# Patient Record
Sex: Male | Born: 1995 | Race: White | Hispanic: No | Marital: Single | Smoking: Never smoker
Health system: Southern US, Community
[De-identification: ages and names within clinical notes are randomized; demographics above are authoritative.]

## PROBLEM LIST (undated history)

## (undated) HISTORY — PX: TONSILLECTOMY AND ADENOIDECTOMY: SHX28

## (undated) HISTORY — PX: TYMPANOSTOMY TUBE PLACEMENT: SHX32

---

## 2019-01-19 ENCOUNTER — Other Ambulatory Visit: Payer: Self-pay

## 2019-01-19 ENCOUNTER — Emergency Department: Payer: PRIVATE HEALTH INSURANCE

## 2019-01-19 ENCOUNTER — Inpatient Hospital Stay
Admission: EM | Admit: 2019-01-19 | Discharge: 2019-01-21 | DRG: 872 | Disposition: A | Discharge status: Home / Self Care | Payer: PRIVATE HEALTH INSURANCE | Attending: Internal Medicine | Admitting: Internal Medicine

## 2019-01-19 DIAGNOSIS — A419 Sepsis, unspecified organism: Secondary | ICD-10-CM

## 2019-01-19 DIAGNOSIS — J020 Streptococcal pharyngitis: Secondary | ICD-10-CM | POA: Diagnosis present

## 2019-01-19 DIAGNOSIS — K529 Noninfective gastroenteritis and colitis, unspecified: Secondary | ICD-10-CM | POA: Diagnosis present

## 2019-01-19 DIAGNOSIS — R9431 Abnormal electrocardiogram [ECG] [EKG]: Secondary | ICD-10-CM

## 2019-01-19 DIAGNOSIS — R079 Chest pain, unspecified: Secondary | ICD-10-CM

## 2019-01-19 DIAGNOSIS — Z79899 Other long term (current) drug therapy: Secondary | ICD-10-CM

## 2019-01-19 DIAGNOSIS — D72829 Elevated white blood cell count, unspecified: Secondary | ICD-10-CM

## 2019-01-19 DIAGNOSIS — R0602 Shortness of breath: Secondary | ICD-10-CM | POA: Diagnosis not present

## 2019-01-19 DIAGNOSIS — B950 Streptococcus, group A, as the cause of diseases classified elsewhere: Secondary | ICD-10-CM | POA: Diagnosis present

## 2019-01-19 DIAGNOSIS — M4184 Other forms of scoliosis, thoracic region: Secondary | ICD-10-CM | POA: Diagnosis present

## 2019-01-19 DIAGNOSIS — R109 Unspecified abdominal pain: Secondary | ICD-10-CM | POA: Diagnosis present

## 2019-01-19 DIAGNOSIS — I880 Nonspecific mesenteric lymphadenitis: Secondary | ICD-10-CM | POA: Diagnosis present

## 2019-01-19 DIAGNOSIS — F1290 Cannabis use, unspecified, uncomplicated: Secondary | ICD-10-CM | POA: Diagnosis present

## 2019-01-19 LAB — CBC WITH DIFFERENTIAL/PLATELET
Abs Immature Granulocytes: 0.25 10*3/uL — ABNORMAL HIGH (ref 0.00–0.07)
Basophils Absolute: 0.1 10*3/uL (ref 0.0–0.1)
Basophils Relative: 0 %
Eosinophils Absolute: 0 10*3/uL (ref 0.0–0.5)
Eosinophils Relative: 0 %
HCT: 54.1 % — ABNORMAL HIGH (ref 39.0–52.0)
Hemoglobin: 19 g/dL — ABNORMAL HIGH (ref 13.0–17.0)
Immature Granulocytes: 1 %
Lymphocytes Relative: 2 %
Lymphs Abs: 0.5 10*3/uL — ABNORMAL LOW (ref 0.7–4.0)
MCH: 31.1 pg (ref 26.0–34.0)
MCHC: 35.1 g/dL (ref 30.0–36.0)
MCV: 88.5 fL (ref 80.0–100.0)
MONO ABS: 2 10*3/uL — AB (ref 0.1–1.0)
MONOS PCT: 7 %
Neutro Abs: 25.6 10*3/uL — ABNORMAL HIGH (ref 1.7–7.7)
Neutrophils Relative %: 90 %
Platelets: 282 10*3/uL (ref 150–400)
RBC: 6.11 MIL/uL — ABNORMAL HIGH (ref 4.22–5.81)
RDW: 11.9 % (ref 11.5–15.5)
WBC: 28.3 10*3/uL — ABNORMAL HIGH (ref 4.0–10.5)
nRBC: 0 % (ref 0.0–0.2)

## 2019-01-19 LAB — BASIC METABOLIC PANEL
ANION GAP: 15 (ref 5–15)
BUN: 15 mg/dL (ref 6–20)
CO2: 20 mmol/L — ABNORMAL LOW (ref 22–32)
Calcium: 9.8 mg/dL (ref 8.9–10.3)
Chloride: 101 mmol/L (ref 98–111)
Creatinine, Ser: 1.08 mg/dL (ref 0.61–1.24)
GFR calc Af Amer: >60 mL/min (ref >60)
GFR calc non Af Amer: >60 mL/min (ref >60)
Glucose, Bld: 165 mg/dL — ABNORMAL HIGH (ref 70–99)
Potassium: 3.8 mmol/L (ref 3.5–5.1)
Sodium: 136 mmol/L (ref 135–145)

## 2019-01-19 LAB — CBC
HCT: 54.2 % — ABNORMAL HIGH (ref 39.0–52.0)
Hemoglobin: 19.1 g/dL — ABNORMAL HIGH (ref 13.0–17.0)
MCH: 31 pg (ref 26.0–34.0)
MCHC: 35.2 g/dL (ref 30.0–36.0)
MCV: 87.8 fL (ref 80.0–100.0)
Platelets: 288 10*3/uL (ref 150–400)
RBC: 6.17 MIL/uL — ABNORMAL HIGH (ref 4.22–5.81)
RDW: 12 % (ref 11.5–15.5)
WBC: 28.7 10*3/uL — AB (ref 4.0–10.5)
nRBC: 0 % (ref 0.0–0.2)

## 2019-01-19 LAB — MONONUCLEOSIS SCREEN: MONO SCREEN: NEGATIVE

## 2019-01-19 LAB — INFLUENZA PANEL BY PCR (TYPE A & B)
Influenza A By PCR: NEGATIVE
Influenza B By PCR: NEGATIVE

## 2019-01-19 LAB — TROPONIN I

## 2019-01-19 LAB — GROUP A STREP BY PCR: Group A Strep by PCR: DETECTED — AB

## 2019-01-19 MED ORDER — SODIUM CHLORIDE 0.9 % IV BOLUS: 1000.0000 mL | Freq: Once | INTRAVENOUS | Status: DC

## 2019-01-19 MED ORDER — IOHEXOL 300 MG/ML  SOLN
100.0000 mL | Freq: Once | INTRAMUSCULAR | Status: AC | PRN
  Administered 2019-01-19: 100 mL via INTRAVENOUS

## 2019-01-19 MED ORDER — SODIUM CHLORIDE 0.9 % IV SOLN
2.0000 g | Freq: Once | INTRAVENOUS | Status: AC
  Administered 2019-01-19: 2 g via INTRAVENOUS
  Filled 2019-01-19: qty 20

## 2019-01-19 MED ORDER — METRONIDAZOLE IN NACL 5-0.79 MG/ML-% IV SOLN
500.0000 mg | Freq: Once | INTRAVENOUS | Status: AC
  Administered 2019-01-19: 500 mg via INTRAVENOUS
  Filled 2019-01-19: qty 100

## 2019-01-19 MED ORDER — MORPHINE SULFATE (PF) 4 MG/ML IV SOLN
4.0000 mg | Freq: Once | INTRAVENOUS | Status: AC
  Administered 2019-01-19: 4 mg via INTRAVENOUS
  Filled 2019-01-19: qty 1

## 2019-01-19 MED ORDER — ONDANSETRON HCL 4 MG/2ML IJ SOLN
4.0000 mg | Freq: Once | INTRAMUSCULAR | Status: AC
  Administered 2019-01-19: 4 mg via INTRAVENOUS
  Filled 2019-01-19: qty 2

## 2019-01-19 MED ORDER — KETOROLAC TROMETHAMINE 30 MG/ML IJ SOLN
30.0000 mg | Freq: Once | INTRAMUSCULAR | Status: AC
  Administered 2019-01-19: 30 mg via INTRAVENOUS
  Filled 2019-01-19: qty 1

## 2019-01-19 NOTE — ED Triage Notes (Signed)
Pt arrives with complaints of shortness of breath that started 2 weeks prior. Pt states he was seen when the shortness of breath/cough started and was prescribed antibiotics for a "bacterial infection." Pt states he finished that prescribed antibiotic but still was not feeling better and went back to the doctor yesterday. Pt states he had new symptoms yesterday of sore throat and swollen lymph nodes. Pt states he started second antibiotic today and became sick on his stomach.

## 2019-01-19 NOTE — ED Notes (Signed)
Patient transported to CT 

## 2019-01-19 NOTE — ED Provider Notes (Signed)
[facility] Emergency Department Provider Note ____________________________________________   First MD Initiated Contact with Patient 01/19/19 1837     (approximate)  I have reviewed the triage vital signs and the nursing notes.  HISTORY  Chief Complaint Shortness of Breath  HPI Emily Filbertlex Tine is a 23 y.o. male here for evaluation for  shortness of breath cough and recent infection that is not getting better  Patient reports about 6 to 7 days ago he started to have a sore throat swollen lymph nodes and a cough.  [facility] and he was given azithromycin when she took a Z-Pak.  Then about 2 days ago he was re-seen as he was not getting better and he was started on cefdinir which she has been taking for 2 days but not improving  He continues report that he is having cough, shortness of breath, pain along the left lower ribs and left upper abdomen.  Started having vomiting today reports he is vomited several times not black or bloody.  He is also continued to have a sore throat, muscle aches.  Reports that they checked [facility], but the have not been able to get his result yet.  Denies any travel history.  He has a friend who is been with the traveled to [place] but that was 2 months ago.  He has not been checked for the flu yet.  No lightheadedness.  Does not feel too short of breath right now, but mostly reports pain in the left upper abdominal region that radiates slightly the left lower back  He has not been on any steroid medication  History reviewed. No pertinent past medical history.  There are no active problems to display for this patient.   History reviewed. No pertinent surgical history.  Prior to Admission medications   Medication Sig Start Date End Date Taking? Authorizing Provider  albuterol (PROVENTIL HFA;VENTOLIN HFA) 108 (90 Base) MCG/ACT inhaler Inhale 2 puffs into the lungs every 4 (four) hours as needed.  01/09/19  Yes [provider]  cefdinir (OMNICEF) 300 MG capsule Take 1 capsule by mouth 2 (two) times daily. 01/19/19  Yes [provider]  azithromycin (ZITHROMAX) 250 MG tablet Take 1 tablet by mouth as directed. 01/09/19   [provider]    Allergies Patient has no known allergies.  No family history on file.  Social History Social History   Tobacco Use  . Smoking status: Not on file  Substance Use Topics  . Alcohol use: Not on file  . Drug use: Not on file  No vaping Denies alcohol and illicit drug use for occasional smoking of marijuana  Review of Systems Constitutional: Had chills and muscle aches when this started about a week ago. Eyes: No visual changes. ENT: Sore throat.  Denies neck pain.  Reports swollen lymph nodes however. Cardiovascular: Denies chest pain. Respiratory: Denies shortness of breath at rest, but does get winded easily when walking. Gastrointestinal: Some abdominal pain located primarily in the left upper abdomen radiates slightly to the left back. Genitourinary: Negative for dysuria. Musculoskeletal: Negative for back pain except for some discomfort in the left back pointing towards his lower thoracic upper lumbar region. Skin: Negative for rash. Neurological: Negative for headaches, areas of focal weakness or numbness.    ____________________________________________   PHYSICAL EXAM:  VITAL SIGNS: ED Triage Vitals  Enc Vitals Group     BP 01/19/19 1756 128/87  Pulse Rate 01/19/19 1756 100     Resp 01/19/19 1756 16     Temp 01/19/19 1756 98.9 F (37.2 C)     Temp Source 01/19/19 1756 Oral     SpO2 01/19/19 1756 100 %     Weight 01/19/19 1757 190 lb (86.2 kg)     Height 01/19/19 1757  (1.778 m)     Head Circumference --      Peak Flow --      Pain Score 01/19/19 1757 6     Pain Loc --      Pain Edu? --      Excl. in GC? --     Constitutional: Alert and oriented.  Appears in some discomfort, pacing  back and forth in the room reporting he is having a lot of pain in the left upper abdominal region Eyes: Conjunctivae are normal. Head: Atraumatic. Nose: No congestion/rhinnorhea. Mouth/Throat: Mucous membranes are lightly dry, previous tonsillectomy, his posterior oropharynx does appear notably injected but without any shift or evidence of mass or edema. Neck: No stridor.  No meningismus.  He has shotty tender anterior and posterior cervical adenopathy bilateral Cardiovascular: Minimally tachycardic rate, regular rhythm. Grossly normal heart sounds.  Good peripheral circulation. Respiratory: Normal respiratory effort.  No retractions. Lungs CTAB.  Speaks in full and clear sentences with normal oxygen saturation. Gastrointestinal: Soft and ports tenderness primarily in the left upper quadrant, no rebound or guarding, does report focal discomfort in the left upper quadrant.  No pain to McBurney's point.  Negative Murphy.  No peritonitis.. No distention. Musculoskeletal: No lower extremity tenderness nor edema. Neurologic:  Normal speech and language. No gross focal neurologic deficits are appreciated.  Skin:  Skin is warm, dry and intact. No rash noted. Psychiatric: Mood and affect are normal. Speech and behavior are normal.  ____________________________________________   LABS (all labs ordered are listed, but only abnormal results are displayed)  Labs Reviewed  GROUP A STREP BY PCR - Abnormal; Notable for the following components:      Result Value   Group A Strep by PCR DETECTED (*)    All other components within normal limits  BASIC METABOLIC PANEL - Abnormal; Notable for the following components:   CO2 20 (*)    Glucose, Bld 165 (*)    All other components within normal limits  CBC - Abnormal; Notable for the following components:   WBC 28.7 (*)    RBC 6.17 (*)    Hemoglobin 19.1 (*)    HCT 54.2 (*)    All other components within normal limits  CBC WITH DIFFERENTIAL/PLATELET -  Abnormal; Notable for the following components:   WBC 28.3 (*)    RBC 6.11 (*)    Hemoglobin 19.0 (*)    HCT 54.1 (*)    Neutro Abs 25.6 (*)    Lymphs Abs 0.5 (*)    Monocytes Absolute 2.0 (*)    Abs Immature Granulocytes 0.25 (*)    All other components within normal limits  CULTURE, BLOOD (ROUTINE X 2)  CULTURE, BLOOD (ROUTINE X 2)  TROPONIN I  MONONUCLEOSIS SCREEN  INFLUENZA PANEL BY PCR (TYPE A & B)  URINALYSIS, COMPLETE (UACMP) WITH MICROSCOPIC   ____________________________________________  EKG  Reviewed enterotomy at 1755 Heart rate 102 QRS 80 QTc 400 Sinus tachycardia, deep T wave inversions in inferolateral leads.  No ST elevation. ____________________________________________  RADIOLOGY  Dg Chest 2 View  Result Date: 01/19/2019 CLINICAL DATA:  Cough and shortness of breath  EXAM: CHEST - 2 VIEW COMPARISON:  None. FINDINGS: Lungs are clear. Heart size and pulmonary vascularity are normal. No adenopathy. There is mild upper thoracic dextroscoliosis. IMPRESSION: No edema or consolidation. Electronically Signed   By: Bretta Bang III M.D.   On: 01/19/2019 18:44   Ct Abdomen Pelvis W Contrast  Result Date: 01/19/2019 CLINICAL DATA:  Abdominal pain and vomiting EXAM: CT ABDOMEN AND PELVIS WITH CONTRAST TECHNIQUE: Multidetector CT imaging of the abdomen and pelvis was performed using the standard protocol following bolus administration of intravenous contrast. CONTRAST:  OMNIPAQUE IOHEXOL 300 MG/ML  SOLN COMPARISON:  None. FINDINGS: Lower chest: Lung bases are clear. Hepatobiliary: No focal liver lesions are apparent. The gallbladder wall is not appreciably thickened. There is no biliary duct dilatation. Pancreas: There is no pancreatic mass or inflammatory focus. Spleen: No splenic lesions are appreciable. There is a small accessory spleen medially. Adrenals/Urinary Tract: Adrenals bilaterally appear normal. Kidneys bilaterally show no evident mass or hydronephrosis.  There is no renal or ureteral calculus on either side. Urinary bladder is midline with wall thickness within normal limits. Stomach/Bowel: There is no appreciable bowel wall or mesenteric thickening. There is no appreciable bowel obstruction. There is no free air or portal venous air. Vascular/Lymphatic: There is no abdominal aortic aneurysm. No vascular lesions are evident. There is no adenopathy in the abdomen or pelvis by size criteria. There are several subcentimeter lymph nodes in the right abdomen. Reproductive: Prostate and seminal vesicles are normal in size and contour. There is no evident pelvic mass. Other: There is no appendiceal region inflammation. There is no abscess or ascites in the abdomen or pelvis. Musculoskeletal: There are no blastic or lytic bone lesions. There is no intramuscular or abdominal wall lesion. IMPRESSION: 1. There are several right abdominal subcentimeter mesenteric lymph nodes. In the appropriate clinical setting, this finding may indicate a degree of mesenteric adenitis. By size criteria, there is no frank adenopathy in the abdomen or pelvis. 2. No evident bowel obstruction. No abscess in the abdomen pelvis. No appendiceal region inflammatory change. 3.  No renal or ureteral calculus.  No hydronephrosis. Electronically Signed   By: Bretta Bang III M.D.   On: 01/19/2019 19:35    Above imaging studies both discussed with infectious disease physician. ____________________________________________   PROCEDURES  Procedure(s) performed: None  Procedures  Critical Care performed: No  ____________________________________________   INITIAL IMPRESSION / ASSESSMENT AND PLAN / ED COURSE  Pertinent labs & imaging results that were available during my care of the patient were reviewed by me and considered in my medical decision making (see chart for details).   Here for evaluation with sore throat, cough, worsening symptoms despite treatment with 2 antibiotics.   Some slight shortness of breath and now developing vomiting and left upper abdominal pain.  Overall the patient does appear mildly ill, he is in no acute distress but did have tachycardia on presentation and his leukocytosis is quite notable with significant left shift.  Also of note, his EKG is abnormal, certainly not what we would expect for a young healthy male, with notable T wave inversions in inferolateral distribution.  He does however deny any chest pain.    I discussed the patient's case with infectious disease Dr. Joylene Draft.  She recommends that given the patient's presentation, she would consider also the possibility of fusiform bacteria or anaerobics given his notably elevated white count after treatment with 2 antibiotics.  And recommends initiation of Flagyl in addition to ceftriaxone.  Blood cultures have been ordered, additional antibiotic Flagyl.  His EKG changes are unusual as well, I discussed his case and care with Dr. Sheryle Hail of the hospitalist service.  Will admit the patient for further work-up and management, I suspect an unusual or atypical infectious etiology and infectious disease recommends blood cultures which have been ordered as well as consideration for other etiologies such as septic emboli etc.  Discussed with the patient, will admit for further care and management, anticipate infectious disease consultation tomorrow which I have spoken to ID about.  Hospitalist Dr. Sheryle Hail admitting.  ____________________________________________   FINAL CLINICAL IMPRESSION(S) / ED DIAGNOSES  Final diagnoses:  Group A streptococcal infection  Leukocytosis, unspecified type  EKG abnormalities  Sepsis, due to unspecified organism, unspecified whether acute organ dysfunction present [facility])  Meet sepsis criteria, elevated heart rate on EKG, severe leukocytosis and high suspicion for infection including confirmed group A strep also exclude other etiologies or concomitant infection       Note:  This document was prepared using Dragon voice recognition software and may include unintentional dictation errors       Sharyn Creamer, MD 01/20/19 0015

## 2019-01-20 DIAGNOSIS — D72829 Elevated white blood cell count, unspecified: Secondary | ICD-10-CM

## 2019-01-20 DIAGNOSIS — F1290 Cannabis use, unspecified, uncomplicated: Secondary | ICD-10-CM | POA: Diagnosis present

## 2019-01-20 DIAGNOSIS — R0602 Shortness of breath: Secondary | ICD-10-CM | POA: Diagnosis present

## 2019-01-20 DIAGNOSIS — I880 Nonspecific mesenteric lymphadenitis: Secondary | ICD-10-CM | POA: Diagnosis present

## 2019-01-20 DIAGNOSIS — K529 Noninfective gastroenteritis and colitis, unspecified: Secondary | ICD-10-CM | POA: Diagnosis present

## 2019-01-20 DIAGNOSIS — J020 Streptococcal pharyngitis: Secondary | ICD-10-CM

## 2019-01-20 DIAGNOSIS — R9341 Abnormal radiologic findings on diagnostic imaging of renal pelvis, ureter, or bladder: Secondary | ICD-10-CM

## 2019-01-20 DIAGNOSIS — B950 Streptococcus, group A, as the cause of diseases classified elsewhere: Secondary | ICD-10-CM | POA: Diagnosis present

## 2019-01-20 DIAGNOSIS — R9431 Abnormal electrocardiogram [ECG] [EKG]: Secondary | ICD-10-CM | POA: Diagnosis not present

## 2019-01-20 DIAGNOSIS — A419 Sepsis, unspecified organism: Secondary | ICD-10-CM | POA: Diagnosis present

## 2019-01-20 DIAGNOSIS — Z79899 Other long term (current) drug therapy: Secondary | ICD-10-CM | POA: Diagnosis not present

## 2019-01-20 DIAGNOSIS — R109 Unspecified abdominal pain: Secondary | ICD-10-CM | POA: Diagnosis present

## 2019-01-20 DIAGNOSIS — M4184 Other forms of scoliosis, thoracic region: Secondary | ICD-10-CM | POA: Diagnosis present

## 2019-01-20 LAB — COMPREHENSIVE METABOLIC PANEL
ALT: 15 U/L (ref 0–44)
ANION GAP: 10 (ref 5–15)
AST: 17 U/L (ref 15–41)
Albumin: 4.1 g/dL (ref 3.5–5.0)
Alkaline Phosphatase: 53 U/L (ref 38–126)
BUN: 15 mg/dL (ref 6–20)
CO2: 25 mmol/L (ref 22–32)
Calcium: 8.9 mg/dL (ref 8.9–10.3)
Chloride: 103 mmol/L (ref 98–111)
Creatinine, Ser: 0.95 mg/dL (ref 0.61–1.24)
GFR calc Af Amer: >60 mL/min (ref >60)
GFR calc non Af Amer: >60 mL/min (ref >60)
GLUCOSE: 115 mg/dL — AB (ref 70–99)
Potassium: 4.5 mmol/L (ref 3.5–5.1)
Sodium: 138 mmol/L (ref 135–145)
Total Bilirubin: 0.6 mg/dL (ref 0.3–1.2)
Total Protein: 7.5 g/dL (ref 6.5–8.1)

## 2019-01-20 LAB — GASTROINTESTINAL PANEL BY PCR, STOOL (REPLACES STOOL CULTURE)
ASTROVIRUS: NOT DETECTED
Adenovirus F40/41: NOT DETECTED
Campylobacter species: NOT DETECTED
Cryptosporidium: NOT DETECTED
Cyclospora cayetanensis: NOT DETECTED
Entamoeba histolytica: NOT DETECTED
Enteroaggregative E coli (EAEC): NOT DETECTED
Enteropathogenic E coli (EPEC): NOT DETECTED
Enterotoxigenic E coli (ETEC): NOT DETECTED
Giardia lamblia: NOT DETECTED
NOROVIRUS GI/GII: NOT DETECTED
Plesimonas shigelloides: NOT DETECTED
Rotavirus A: NOT DETECTED
SAPOVIRUS (I, II, IV, AND V): NOT DETECTED
SHIGA LIKE TOXIN PRODUCING E COLI (STEC): NOT DETECTED
Salmonella species: NOT DETECTED
Shigella/Enteroinvasive E coli (EIEC): NOT DETECTED
Vibrio cholerae: NOT DETECTED
Vibrio species: NOT DETECTED
Yersinia enterocolitica: NOT DETECTED

## 2019-01-20 LAB — CKMB (ARMC ONLY)
CK, MB: 1.8 ng/mL (ref 0.5–5.0)
CK, MB: 1.2 ng/mL (ref 0.5–5.0)

## 2019-01-20 LAB — URINALYSIS, COMPLETE (UACMP) WITH MICROSCOPIC
BILIRUBIN URINE: NEGATIVE
Glucose, UA: NEGATIVE mg/dL
Hgb urine dipstick: NEGATIVE
Ketones, ur: 20 mg/dL — AB
Leukocytes,Ua: NEGATIVE
Nitrite: NEGATIVE
PROTEIN: NEGATIVE mg/dL
Specific Gravity, Urine: >1.046 — ABNORMAL HIGH (ref 1.005–1.030)
Squamous Epithelial / HPF: NONE SEEN (ref 0–5)
pH: 5 (ref 5.0–8.0)

## 2019-01-20 LAB — CBC
HCT: 44.3 % (ref 39.0–52.0)
Hemoglobin: 15.4 g/dL (ref 13.0–17.0)
MCH: 30.8 pg (ref 26.0–34.0)
MCHC: 34.8 g/dL (ref 30.0–36.0)
MCV: 88.6 fL (ref 80.0–100.0)
PLATELETS: 252 10*3/uL (ref 150–400)
RBC: 5 MIL/uL (ref 4.22–5.81)
RDW: 12 % (ref 11.5–15.5)
WBC: 14.6 10*3/uL — ABNORMAL HIGH (ref 4.0–10.5)
nRBC: 0 % (ref 0.0–0.2)

## 2019-01-20 LAB — URINE DRUG SCREEN, QUALITATIVE (ARMC ONLY)
Amphetamines, Ur Screen: NOT DETECTED
BENZODIAZEPINE, UR SCRN: NOT DETECTED
Barbiturates, Ur Screen: NOT DETECTED
Cannabinoid 50 Ng, Ur ~~LOC~~: POSITIVE — AB
Cocaine Metabolite,Ur ~~LOC~~: NOT DETECTED
MDMA (Ecstasy)Ur Screen: NOT DETECTED
Methadone Scn, Ur: NOT DETECTED
Opiate, Ur Screen: NOT DETECTED
Phencyclidine (PCP) Ur S: NOT DETECTED
Tricyclic, Ur Screen: NOT DETECTED

## 2019-01-20 LAB — LACTIC ACID, PLASMA
Lactic Acid, Venous: 0.9 mmol/L (ref 0.5–1.9)
Lactic Acid, Venous: 0.7 mmol/L (ref 0.5–1.9)

## 2019-01-20 LAB — TROPONIN I: Troponin I: <0.03 ng/mL (ref <0.03)

## 2019-01-20 LAB — C DIFFICILE QUICK SCREEN W PCR REFLEX
C DIFFICLE (CDIFF) ANTIGEN: NEGATIVE
C Diff interpretation: NOT DETECTED
C Diff toxin: NEGATIVE

## 2019-01-20 LAB — LACTOFERRIN, FECAL, QUALITATIVE: Lactoferrin, Fecal, Qual: POSITIVE — AB

## 2019-01-20 MED ORDER — MORPHINE SULFATE (PF) 2 MG/ML IV SOLN: 2.0000 mg | Freq: Three times a day (TID) | INTRAVENOUS | Status: DC | PRN

## 2019-01-20 MED ORDER — KETOROLAC TROMETHAMINE 30 MG/ML IJ SOLN
30.0000 mg | Freq: Four times a day (QID) | INTRAMUSCULAR | Status: DC | PRN
  Administered 2019-01-20: 30 mg via INTRAVENOUS
  Filled 2019-01-20: qty 1

## 2019-01-20 MED ORDER — ACETAMINOPHEN 650 MG RE SUPP: 650.0000 mg | Freq: Four times a day (QID) | RECTAL | Status: DC | PRN

## 2019-01-20 MED ORDER — METRONIDAZOLE IN NACL 5-0.79 MG/ML-% IV SOLN
500.0000 mg | Freq: Three times a day (TID) | INTRAVENOUS | Status: DC
  Administered 2019-01-20 – 2019-01-21 (×4): 500 mg via INTRAVENOUS
  Filled 2019-01-20 (×6): qty 100

## 2019-01-20 MED ORDER — BISACODYL 5 MG PO TBEC: 5.0000 mg | DELAYED_RELEASE_TABLET | Freq: Every day | ORAL | Status: DC | PRN

## 2019-01-20 MED ORDER — ONDANSETRON HCL 4 MG/2ML IJ SOLN: 4.0000 mg | Freq: Four times a day (QID) | INTRAMUSCULAR | Status: DC | PRN

## 2019-01-20 MED ORDER — ALBUTEROL SULFATE (2.5 MG/3ML) 0.083% IN NEBU: 2.5000 mg | INHALATION_SOLUTION | RESPIRATORY_TRACT | Status: DC | PRN

## 2019-01-20 MED ORDER — OXYCODONE HCL 5 MG PO TABS
5.0000 mg | ORAL_TABLET | ORAL | Status: DC | PRN
  Administered 2019-01-20 (×3): 5 mg via ORAL
  Filled 2019-01-20 (×3): qty 1

## 2019-01-20 MED ORDER — SODIUM CHLORIDE 0.9 % IV SOLN
INTRAVENOUS | Status: DC
  Administered 2019-01-20: 02:00:00 via INTRAVENOUS

## 2019-01-20 MED ORDER — TRAZODONE HCL 50 MG PO TABS
25.0000 mg | ORAL_TABLET | Freq: Every evening | ORAL | Status: DC | PRN
  Administered 2019-01-20: 25 mg via ORAL
  Filled 2019-01-20: qty 1

## 2019-01-20 MED ORDER — SODIUM CHLORIDE 0.9 % IV SOLN
2.0000 g | INTRAVENOUS | Status: DC
  Administered 2019-01-20: 2 g via INTRAVENOUS
  Filled 2019-01-20: qty 20
  Filled 2019-01-20: qty 2

## 2019-01-20 MED ORDER — ONDANSETRON HCL 4 MG PO TABS: 4.0000 mg | ORAL_TABLET | Freq: Four times a day (QID) | ORAL | Status: DC | PRN

## 2019-01-20 MED ORDER — ACETAMINOPHEN 325 MG PO TABS: 650.0000 mg | ORAL_TABLET | Freq: Four times a day (QID) | ORAL | Status: DC | PRN

## 2019-01-20 MED ORDER — MORPHINE SULFATE (PF) 2 MG/ML IV SOLN: 2.0000 mg | INTRAVENOUS | Status: DC | PRN

## 2019-01-20 MED ORDER — ENOXAPARIN SODIUM 40 MG/0.4ML ~~LOC~~ SOLN
40.0000 mg | SUBCUTANEOUS | Status: DC
  Filled 2019-01-20: qty 0.4

## 2019-01-20 NOTE — H&P (Signed)
[facility] -  at [facility]   PATIENT NAME: Jesse Roy    MR#:  [id number]  DATE OF BIRTH:  May 20, 1996  DATE OF ADMISSION:  01/19/2019  PRIMARY CARE PHYSICIAN: Patient, No Pcp Per   REQUESTING/REFERRING PHYSICIAN: Sharyn Creamer, MD  CHIEF COMPLAINT:   Chief Complaint  Patient presents with  . Shortness of Breath  Sore throat, feeling sick, left upper quadrant abdominal pain  HISTORY OF PRESENT ILLNESS:  Jesse Roy  is a 23 y.o. male with no chronic medical problems who presented to the emergency room with acute onset of sore throat as well as left upper quadrant abdominal pain and recurrent nausea and vomiting with associated left upper back pain leading from his left upper abdominal quadrant.  He stated that he has been feeling sick over the last couple of weeks.  He goes to [facility] and has been having rhinorrhea and nasal congestion as well as cough productive of clear sputum about 11 days ago for which she was seen there and was given Z-Pak.  He stated that he felt better by Friday that is 3 days later.  On Monday he felt sick again and developed sore throat with cervical tender lymphadenopathy.  He denied any fever or chills then.  He stated that he was tested for mono spot test at that time when he was seen back but does not know the results.  He was given a prescription for clindamycin of which he took 2 doses and then started to have vomiting all day and could not keep any fluids down.  He denied any bilious vomitus or hematemesis.  He admitted to watery diarrhea without melena or bright red blood.  He said that the left upper back pain was excruciating.  He denied any dysuria oliguria or hematuria or flank pain.  No cough or wheezing or hemoptysis.  He denies any chest pain.  Upon presentation to the emergency room, his vital signs were within normal.  His CBC was remarkable for leukocytosis of 14.6 with neutrophils of 90% and absolute neutrophils of  25.6.  His influenza AB antigen came back negative as well as his Monospot test.  His group A strep by PCR came back positive.  He had 2 blood cultures drawn.  He had abdominal and pelvic CT scan which revealed possible mesenteric adenitis with no other acute abnormalities.  Two-view portable chest showed mild thoracic dextro scoliosis.  EKG showed sinus tachycardia with a rate of 102 with T wave inversion laterally and biatrial enlargement..  The patient was given IV Rocephin and Flagyl as well as IV Toradol and morphine sulfate as well as Zofran and 2 L bolus of IV normal saline emergency room.  He was initially placed on droplet precautions and that was later discontinued.  He is admitted to a medical monitored bed for further evaluation and management PAST MEDICAL HISTORY:  History reviewed. No pertinent past medical history.  PAST SURGICAL HISTORY:  1. Tonsillectomy 2.  4 myringotomy tubes  SOCIAL HISTORY:   Social History   Tobacco Use  . Smoking status: Never Smoker  . Smokeless tobacco: Never Used  Substance Use Topics  . Alcohol use: Not on file    FAMILY HISTORY:  No family history on file.  The patient denies any family history medical problems  DRUG ALLERGIES:  No Known Allergies  REVIEW OF SYSTEMS:   ROS As per history of present illness. All pertinent systems were reviewed above. Constitutional,  HEENT,  cardiovascular, respiratory, GI, GU, musculoskeletal, neuro, psychiatric, endocrine,  integumentary and hematologic systems were reviewed and are otherwise  negative/unremarkable except for positive findings mentioned above in the HPI.   MEDICATIONS AT HOME:   Prior to Admission medications   Medication Sig Start Date End Date Taking? Authorizing Provider  albuterol (PROVENTIL HFA;VENTOLIN HFA) 108 (90 Base) MCG/ACT inhaler Inhale 2 puffs into the lungs every 4 (four) hours as needed. 01/09/19  Yes [provider]  cefdinir (OMNICEF) 300 MG capsule Take 1  capsule by mouth 2 (two) times daily. 01/19/19  Yes [provider]  azithromycin (ZITHROMAX) 250 MG tablet Take 1 tablet by mouth as directed. 01/09/19   [provider]      VITAL SIGNS:  Blood pressure 129/72, pulse 83, temperature 98.3 F (36.8 C), temperature source Oral, resp. rate 18, height 5\' 10"  (1.778 m), weight 86.2 kg, SpO2 99 %.  PHYSICAL EXAMINATION:  Physical Exam  GENERAL:  22 y.o.-year-old Caucasian male patient lying in the bed with no acute distress.  EYES: Pupils equal, round, reactive to light and accommodation. No scleral icterus. Extraocular muscles intact.  HEENT: Head atraumatic, normocephalic. Oropharynx: Moist mucous membranes slightly dry tongue with pharyngeal erythema.  Nose clear NECK:  Supple, no jugular venous distention. No thyroid enlargement, no tenderness.  LUNGS: Normal breath sounds bilaterally, no wheezing, rales,rhonchi or crepitation. No use of accessory muscles of respiration.  CARDIOVASCULAR: S1, S2 normal. No murmurs, rubs, or gallops.  ABDOMEN: Soft, nontender, nondistended. Bowel sounds present. No organomegaly or mass.  EXTREMITIES: No pedal edema, cyanosis, or clubbing.  NEUROLOGIC: Cranial nerves II through XII are intact. Muscle strength 5/5 in all extremities. Sensation intact. Gait not checked.  PSYCHIATRIC: The patient is alert and oriented x 3.  SKIN: No obvious rash, lesion, or ulcer.   LABORATORY PANEL:   CBC Recent Labs  Lab 01/20/19 0314  WBC 14.6*  HGB 15.4  HCT 44.3  PLT 252   ------------------------------------------------------------------------------------------------------------------  Chemistries  Recent Labs  Lab 01/20/19 0314  NA 138  K 4.5  CL 103  CO2 25  GLUCOSE 115*  BUN 15  CREATININE 0.95  CALCIUM 8.9  AST 17  ALT 15  ALKPHOS 53  BILITOT 0.6   ------------------------------------------------------------------------------------------------------------------  Cardiac  Enzymes Recent Labs  Lab 01/19/19 1812  TROPONINI <0.03   ------------------------------------------------------------------------------------------------------------------  RADIOLOGY:  Dg Chest 2 View  Result Date: 01/19/2019 CLINICAL DATA:  Cough and shortness of breath EXAM: CHEST - 2 VIEW COMPARISON:  None. FINDINGS: Lungs are clear. Heart size and pulmonary vascularity are normal. No adenopathy. There is mild upper thoracic dextroscoliosis. IMPRESSION: No edema or consolidation. Electronically Signed   By: William  Woodruff III M.D.   On: 01/19/2019 18:44   Ct Abdomen Pelvis W Contrast  Result Date: 01/19/2019 CLINICAL DATA:  Abdominal pain and vomiting EXAM: CT ABDOMEN AND PELVIS WITH CONTRAST TECHNIQUE: Multidetector CT imaging of the abdomen and pelvis was performed using the standard protocol following bolus administration of intravenous contrast. CONTRAST:  KreCamillia erKreCamillia erKreCamillia<MEASUREMENTCamillia erKreCamillia erKreCamillia erKreCamillia erKreCamillia erKreCamillia Herters DillE IOHEXOL 300 MG/ML  SOLN COMPARISON:  None. FINDINGS: Lower chest: Lung bases are clear. Hepatobiliary: No focal liver lesions are apparent. The gallbladder wall is not appreciably thickened. There is no biliary duct dilatation. Pancreas: There is no pancreatic mass or inflammatory focus. Spleen: No splenic lesions are appreciable. There is a small accessory spleen medially. Adrenals/Urinary Tract: Adrenals bilaterally appear normal. Kidneys bilaterally show no evident mass or hydronephrosis. There is no renal or ureteral calculus on either side. Urinary bladder  is midline with wall thickness within normal limits. Stomach/Bowel: There is no appreciable bowel wall or mesenteric thickening. There is no appreciable bowel obstruction. There is no free air or portal venous air. Vascular/Lymphatic: There is no abdominal aortic aneurysm. No vascular lesions are evident. There is no adenopathy in the abdomen or pelvis by size criteria. There are several subcentimeter lymph nodes in the right abdomen. Reproductive: Prostate and  seminal vesicles are normal in size and contour. There is no evident pelvic mass. Other: There is no appendiceal region inflammation. There is no abscess or ascites in the abdomen or pelvis. Musculoskeletal: There are no blastic or lytic bone lesions. There is no intramuscular or abdominal wall lesion. IMPRESSION: 1. There are several right abdominal subcentimeter mesenteric lymph nodes. In the appropriate clinical setting, this finding may indicate a degree of mesenteric adenitis. By size criteria, there is no frank adenopathy in the abdomen or pelvis. 2. No evident bowel obstruction. No abscess in the abdomen pelvis. No appendiceal region inflammatory change. 3.  No renal or ureteral calculus.  No hydronephrosis. Electronically Signed   By: Bretta Bang III M.D.   On: 01/19/2019 19:35      IMPRESSION AND PLAN:   #1.  Streptococcal pharyngitis with subsequent mild sepsis without severe sepsis or septic shock.  The patient is admitted to medical monitored bed.  He will be placed on IV Rocephin which should cover his strep pharyngitis.  Strep culture will be sent.  He will be placed on IV Toradol for pain.  The patient initially had an ID consult over the phone and therefore Dr.  2.  Acute gastroenteritis.  He has associated mesenteric adenitis.  The patient will be placed on hydration with IV normal saline.  Stool studies will be obtained.  For potential infectious etiology, we added IV Flagyl especially after being given clindamycin.    3.  Left upper quadrant abdominal pain.  Given leukocytosis and mesenteric adenitis mentioned above we will obtain a general surgery consultation for follow-up.  Pain management will be provided.  4.  DVT prophylaxis.  Subcutaneous Lovenox  5.  GI prophylaxis.  IV Protonix  All the records are reviewed and case discussed with ED provider. Management plans discussed with the patient and he is in agreement.  CODE STATUS: Full code TOTAL TIME TAKING CARE OF  THIS PATIENT: 65 minutes.    Hannah Beat M.D on 01/20/2019 at 6:28 AM  Pager - [phone]  After 6pm go to [email or web address] - Social research officer, government  [facility]  Office  [phone]  CC: Primary care physician; Patient, No Pcp Per   Note: This dictation was prepared with Dragon dictation along with smaller phrase technology. Any transcriptional errors that result from this process are unintentional.

## 2019-01-20 NOTE — Progress Notes (Signed)
[facility] - [place] [facility]   PATIENT NAME: Jesse Roy    MR#:  [id number]  DATE OF BIRTH:  Aug 11, 1996  SUBJECTIVE:   Patient came in after he had intractable nausea vomiting after starting clindamycin for URI symptoms. He recently finished the course of Zithromax which she tolerated well. He tested positive for strep throat. He has not had vomiting since ER vomiting yesterday. He feels little better. Wants to try something to drink. No fever. Some abdominal pain left upper quadrant. Continues with some diarrhea. No Blood in stool. REVIEW OF SYSTEMS:   Review of Systems  Constitutional: Negative for chills, fever and weight loss.  HENT: Negative for ear discharge, ear pain and nosebleeds.   Eyes: Negative for blurred vision, pain and discharge.  Respiratory: Negative for sputum production, shortness of breath, wheezing and stridor.   Cardiovascular: Negative for chest pain, palpitations, orthopnea and PND.  Gastrointestinal: Negative for abdominal pain, diarrhea, nausea and vomiting.  Genitourinary: Negative for frequency and urgency.  Musculoskeletal: Negative for back pain and joint pain.  Neurological: Negative for sensory change, speech change, focal weakness and weakness.  Psychiatric/Behavioral: Negative for depression and hallucinations. The patient is not nervous/anxious.    Tolerating Diet: Tolerating PT:   DRUG ALLERGIES:  No Known Allergies  VITALS:  Blood pressure 108/65, pulse 69, temperature 99 F (37.2 C), temperature source Oral, resp. rate 20, height 5\' 10"  (1.778 m), weight 86.2 kg, SpO2 100 %.  PHYSICAL EXAMINATION:   Physical Exam  GENERAL:  23 y.o.-year-old patient lying in the bed with no acute distress.  EYES: Pupils equal, round, reactive to light and accommodation. No scleral icterus. Extraocular muscles intact.  HEENT: Head atraumatic, normocephalic. Oropharynx and nasopharynx clear.  NECK:  Supple, no jugular  venous distention. No thyroid enlargement, no tenderness.  LUNGS: Normal breath sounds bilaterally, no wheezing, rales, rhonchi. No use of accessory muscles of respiration.  CARDIOVASCULAR: S1, S2 normal. No murmurs, rubs, or gallops.  ABDOMEN: Soft,  Mild left upper quadrant tender on palpation, nondistended. Bowel sounds present. No organomegaly or mass.  EXTREMITIES: No cyanosis, clubbing or edema b/l.    NEUROLOGIC: Cranial nerves II through XII are intact. No focal Motor or sensory deficits b/l.   PSYCHIATRIC:  patient is alert and oriented x 3.  SKIN: No obvious rash, lesion, or ulcer.   LABORATORY PANEL:  CBC Recent Labs  Lab 01/20/19 0314  WBC 14.6*  HGB 15.4  HCT 44.3  PLT 252    Chemistries  Recent Labs  Lab 01/20/19 0314  NA 138  K 4.5  CL 103  CO2 25  GLUCOSE 115*  BUN 15  CREATININE 0.95  CALCIUM 8.9  AST 17  ALT 15  ALKPHOS 53  BILITOT 0.6   Cardiac Enzymes Recent Labs  Lab 01/19/19 1812  TROPONINI <0.03   RADIOLOGY:  Dg Chest 2 View  Result Date: 01/19/2019 CLINICAL DATA:  Cough and shortness of breath EXAM: CHEST - 2 VIEW COMPARISON:  None. FINDINGS: Lungs are clear. Heart size and pulmonary vascularity are normal. No adenopathy. There is mild upper thoracic dextroscoliosis. IMPRESSION: No edema or consolidation. Electronically Signed   By: Bretta Bang III M.D.   On: 01/19/2019 18:44   Ct Abdomen Pelvis W Contrast  Result Date: 01/19/2019 CLINICAL DATA:  Abdominal pain and vomiting EXAM: CT ABDOMEN AND PELVIS WITH CONTRAST TECHNIQUE: Multidetector CT imaging of the abdomen and pelvis was performed using the standard protocol following bolus administration of intravenous  contrast. CONTRAST:  OMNIPAQUE IOHEXOL 300 MG/ML  SOLN COMPARISON:  None. FINDINGS: Lower chest: Lung bases are clear. Hepatobiliary: No focal liver lesions are apparent. The gallbladder wall is not appreciably thickened. There is no biliary duct dilatation. Pancreas: There  is no pancreatic mass or inflammatory focus. Spleen: No splenic lesions are appreciable. There is a small accessory spleen medially. Adrenals/Urinary Tract: Adrenals bilaterally appear normal. Kidneys bilaterally show no evident mass or hydronephrosis. There is no renal or ureteral calculus on either side. Urinary bladder is midline with wall thickness within normal limits. Stomach/Bowel: There is no appreciable bowel wall or mesenteric thickening. There is no appreciable bowel obstruction. There is no free air or portal venous air. Vascular/Lymphatic: There is no abdominal aortic aneurysm. No vascular lesions are evident. There is no adenopathy in the abdomen or pelvis by size criteria. There are several subcentimeter lymph nodes in the right abdomen. Reproductive: Prostate and seminal vesicles are normal in size and contour. There is no evident pelvic mass. Other: There is no appendiceal region inflammation. There is no abscess or ascites in the abdomen or pelvis. Musculoskeletal: There are no blastic or lytic bone lesions. There is no intramuscular or abdominal wall lesion. IMPRESSION: 1. There are several right abdominal subcentimeter mesenteric lymph nodes. In the appropriate clinical setting, this finding may indicate a degree of mesenteric adenitis. By size criteria, there is no frank adenopathy in the abdomen or pelvis. 2. No evident bowel obstruction. No abscess in the abdomen pelvis. No appendiceal region inflammatory change. 3.  No renal or ureteral calculus.  No hydronephrosis. Electronically Signed   By: Bretta Bang III M.D.   On: 01/19/2019 19:35   ASSESSMENT AND PLAN:   Jesse Roy  is a 23 y.o. male with no chronic medical problems who presented to the emergency room with acute onset of sore throat as well as left upper quadrant abdominal pain and recurrent nausea and vomiting with associated left upper back pain leading from his left upper abdominal quadrant  #1.  Streptococcal  pharyngitis with subsequent mild sepsis  -on IV Rocephin which should cover his strep pharyngitis.   -prn po tylenol and  IV Toradol for pain. - ID consult with Dr Rivka Safer -WBC 28K--14k -afebrile -HR 60's , d/c tele -CLD  2.  Acute gastroenteritis.  He has associated mesenteric adenitis.   -The patient will be placed on hydration with IV normal saline.   -Stool studies pending.  - For potential infectious etiology, empiric IV Flagyl--can we d/c it  3.  Left upper quadrant abdominal pain.  suspected due to mucscle strain from vomiting -Hold surgical consult for now  4.  DVT prophylaxis.  Subcutaneous Lovenox  patient advised to ambulate once isolation removed. I did discuss at length with patient's mother on the phone.   Case discussed with Care Management/Social Worker. Management plans discussed with the patient, family and they are in agreement.  CODE STATUS: full  DVT Prophylaxis: lovenox,ambulation  TOTAL TIME TAKING CARE OF THIS PATIENT: *30* minutes.  >50% time spent on counselling and coordination of care  POSSIBLE D/C IN *1-2* DAYS, DEPENDING ON CLINICAL CONDITION.  Note: This dictation was prepared with Dragon dictation along with smaller phrase technology. Any transcriptional errors that result from this process are unintentional.  Enedina Finner M.D on 01/20/2019 at 9:39 AM  Between 7am to 6pm - Pager - [phone]  After 6pm go to [email or web address] - Social research officer, government  [facility]  Office  [phone]  CC:  Primary care physician; Patient, No Pcp PerPatient ID: Jesse Roy, male   DOB: [birth year], 23 y.o.   MRN: [id number]

## 2019-01-20 NOTE — ED Notes (Signed)
Toiletry bag given to patient at request of his girlfriend who dropped it off at the front desk.

## 2019-01-20 NOTE — Plan of Care (Signed)

## 2019-01-20 NOTE — ED Notes (Signed)
ED TO INPATIENT HANDOFF REPORT  ED Nurse Name and Phone #:  Terance Hartllison Vineta Carone [phone]  S Name/Age/Gender Jesse FilbertAlex Roy 23 y.o. male Room/Bed: ED10A/ED10A  Code Status   Code Status: Full Code  Home/SNF/Other Home Patient oriented to: self, place, time and situation Is this baseline? Yes   Triage Complete: Triage complete  Chief Complaint Shob  Triage Note Pt arrives with complaints of shortness of breath that started 2 weeks prior. Pt states he was seen when the shortness of breath/cough started and was prescribed antibiotics for a "bacterial infection." Pt states he finished that prescribed antibiotic but still was not feeling better and went back to the doctor yesterday. Pt states he had new symptoms yesterday of sore throat and swollen lymph nodes. Pt states he started second antibiotic today and became sick on his stomach.   Allergies No Known Allergies  Level of Care/Admitting Diagnosis ED Disposition    ED Disposition Condition Comment   Admit  [facility]ospital [facility] [[id number]]  Level of Care: Med-Surg [16]  Diagnosis: Abdominal pain [[id number]][[id number]]  Admitting Physician: Hannah BeatMANSY, JAN A [[id number]]  Attending Physician: Hannah BeatMANSY, JAN A [[id number]]  Estimated length of stay: past midnight tomorrow  Certification:: I certify this patient will need inpatient services for at least 2 midnights  PT Class (Do Not Modify): Inpatient [101]  PT Acc Code (Do Not Modify): Private [1]       B Medical/Surgery History History reviewed. No pertinent past medical history. History reviewed. No pertinent surgical history.   A IV Location/Drains/Wounds Patient Lines/Drains/Airways Status   Active Line/Drains/Airways    Name:   Placement date:   Placement time:   Site:   Days:   Peripheral IV 01/19/19 Right Antecubital   01/19/19    1812    Antecubital   1          Intake/Output Last 24 hours  Intake/Output Summary (Last 24 hours) at 01/20/2019 0109 Last data filed  at 01/20/2019 0018 Gross per 24 hour  Intake 198.6 ml  Output -  Net 198.6 ml    Labs/Imaging Results for orders placed or performed during the hospital encounter of 01/19/19 (from the past 48 hour(s))  Basic metabolic panel     Status: Abnormal   Collection Time: 01/19/19  6:12 PM  Result Value Ref Range   Sodium 136 135 - 145 mmol/L   Potassium 3.8 3.5 - 5.1 mmol/L    Comment: HEMOLYSIS AT THIS LEVEL MAY AFFECT RESULT   Chloride 101 98 - 111 mmol/L   CO2 20 (L) 22 - 32 mmol/L   Glucose, Bld 165 (H) 70 - 99 mg/dL   BUN 15 6 - 20 mg/dL   Creatinine, Ser 6.291.08 0.61 - 1.24 mg/dL   Calcium 9.8 8.9 - 52.810.3 mg/dL   GFR calc non Af Amer >60 >60 mL/min   GFR calc Af Amer >60 >60 mL/min   Anion gap 15 5 - 15    Comment: Performed at [facility], [address] [place], [place] [id number]  CBC     Status: Abnormal   Collection Time: 01/19/19  6:12 PM  Result Value Ref Range   WBC 28.7 (H) 4.0 - 10.5 K/uL   RBC 6.17 (H) 4.22 - 5.81 MIL/uL   Hemoglobin 19.1 (H) 13.0 - 17.0 g/dL   HCT 40.154.2 (H) 02.739.0 - 25.352.0 %   MCV 87.8 80.0 - 100.0 fL   MCH 31.0 26.0 - 34.0 pg   MCHC 35.2 30.0 -  36.0 g/dL   RDW 16.3 84.6 - 65.9 %   Platelets 288 150 - 400 K/uL   nRBC 0.0 0.0 - 0.2 %    Comment: Performed at [facility], [address] [place], [place] [address]  Troponin I - ONCE - STAT     Status: None   Collection Time: 01/19/19  6:12 PM  Result Value Ref Range   Troponin I <0.03 <0.03 ng/mL    Comment: Performed at [facility], [address] [place], [place] [address]  Mononucleosis screen     Status: None   Collection Time: 01/19/19  6:12 PM  Result Value Ref Range   Mono Screen NEGATIVE NEGATIVE    Comment: Performed at [facility], [address] [place], [place] [address]  CBC with Differential     Status: Abnormal   Collection Time: 01/19/19  6:12 PM  Result Value Ref Range   WBC 28.3 (H) 4.0 - 10.5 K/uL   RBC 6.11 (H) 4.22 - 5.81 MIL/uL    Hemoglobin 19.0 (H) 13.0 - 17.0 g/dL   HCT 92.3 (H) 30.0 - 76.2 %   MCV 88.5 80.0 - 100.0 fL   MCH 31.1 26.0 - 34.0 pg   MCHC 35.1 30.0 - 36.0 g/dL   RDW 26.3 33.5 - 45.6 %   Platelets 282 150 - 400 K/uL   nRBC 0.0 0.0 - 0.2 %   Neutrophils Relative % 90 %   Neutro Abs 25.6 (H) 1.7 - 7.7 K/uL   Lymphocytes Relative 2 %   Lymphs Abs 0.5 (L) 0.7 - 4.0 K/uL   Monocytes Relative 7 %   Monocytes Absolute 2.0 (H) 0.1 - 1.0 K/uL   Eosinophils Relative 0 %   Eosinophils Absolute 0.0 0.0 - 0.5 K/uL   Basophils Relative 0 %   Basophils Absolute 0.1 0.0 - 0.1 K/uL   Immature Granulocytes 1 %   Abs Immature Granulocytes 0.25 (H) 0.00 - 0.07 K/uL    Comment: Performed at [facility], [address] [place], [place] [address]  Group A Strep by PCR [facility] Only)     Status: Abnormal   Collection Time: 01/19/19  7:42 PM  Result Value Ref Range   Group A Strep by PCR DETECTED (A) NOT DETECTED    Comment: Performed at [facility], [address] [place], [place] [address]  Influenza panel by PCR (type A & B)     Status: None   Collection Time: 01/19/19  7:42 PM  Result Value Ref Range   Influenza A By PCR NEGATIVE NEGATIVE   Influenza B By PCR NEGATIVE NEGATIVE    Comment: (NOTE) The Xpert Xpress Flu assay is intended as an aid in the diagnosis of  influenza and should not be used as a sole basis for treatment.  This  assay is FDA approved for nasopharyngeal swab specimens only. Nasal  washings and aspirates are unacceptable for Xpert Xpress Flu testing. Performed at [facility], [address] [place], [place] [address]    Dg Chest 2 View  Result Date: 01/19/2019 CLINICAL DATA:  Cough and shortness of breath EXAM: CHEST - 2 VIEW COMPARISON:  None. FINDINGS: Lungs are clear. Heart size and pulmonary vascularity are normal. No adenopathy. There is mild upper thoracic dextroscoliosis. IMPRESSION: No edema or consolidation. Electronically Signed   By: Bretta Bang III M.D.   On: 01/19/2019 18:44   Ct Abdomen Pelvis W Contrast  Result Date: 01/19/2019 CLINICAL  DATA:  Abdominal pain and vomiting EXAM: CT ABDOMEN AND PELVIS WITH CONTRAST TECHNIQUE: Multidetector CT imaging of the abdomen and pelvis was performed using the standard protocol following bolus administration of intravenous contrast. CONTRAST:  OMNIPAQUE IOHEXOL 300 MG/ML  SOLN COMPARISON:  None. FINDINGS: Lower chest: Lung bases are clear. Hepatobiliary: No focal liver lesions are apparent. The gallbladder wall is not appreciably thickened. There is no biliary duct dilatation. Pancreas: There is no pancreatic mass or inflammatory focus. Spleen: No splenic lesions are appreciable. There is a small accessory spleen medially. Adrenals/Urinary Tract: Adrenals bilaterally appear normal. Kidneys bilaterally show no evident mass or hydronephrosis. There is no renal or ureteral calculus on either side. Urinary bladder is midline with wall thickness within normal limits. Stomach/Bowel: There is no appreciable bowel wall or mesenteric thickening. There is no appreciable bowel obstruction. There is no free air or portal venous air. Vascular/Lymphatic: There is no abdominal aortic aneurysm. No vascular lesions are evident. There is no adenopathy in the abdomen or pelvis by size criteria. There are several subcentimeter lymph nodes in the right abdomen. Reproductive: Prostate and seminal vesicles are normal in size and contour. There is no evident pelvic mass. Other: There is no appendiceal region inflammation. There is no abscess or ascites in the abdomen or pelvis. Musculoskeletal: There are no blastic or lytic bone lesions. There is no intramuscular or abdominal wall lesion. IMPRESSION: 1. There are several right abdominal subcentimeter mesenteric lymph nodes. In the appropriate clinical setting, this finding may indicate a degree of mesenteric adenitis. By size criteria, there is no frank adenopathy in the  abdomen or pelvis. 2. No evident bowel obstruction. No abscess in the abdomen pelvis. No appendiceal region inflammatory change. 3.  No renal or ureteral calculus.  No hydronephrosis. Electronically Signed   By: Bretta Bang III M.D.   On: 01/19/2019 19:35    Pending Labs Unresulted Labs (From admission, onward)    Start     Ordered   01/20/19 0500  Comprehensive metabolic panel  Tomorrow morning,   STAT     01/20/19 0053   01/20/19 0500  CBC  Tomorrow morning,   STAT     01/20/19 0053   01/20/19 0045  CBC  (enoxaparin (LOVENOX)    CrCl >/= 30 ml/min)  Once,   STAT    Comments:  Baseline for enoxaparin therapy IF NOT ALREADY DRAWN.  Notify MD if PLT < 100 K.    01/20/19 0053   01/19/19 2305  Blood Culture (routine x 2)  BLOOD CULTURE X 2,   STAT     01/19/19 2304   01/19/19 1909  Urinalysis, Complete w Microscopic  Once,   STAT     01/19/19 1910          Vitals/Pain Today's Vitals   01/19/19 1914 01/19/19 2100 01/19/19 2151 01/19/19 2200  BP:  122/74  124/76  Pulse:  97  86  Resp:      Temp:      TempSrc:      SpO2:  98%  98%  Weight:      Height:      PainSc: 9   3      Isolation Precautions Droplet precaution  Medications Medications  sodium chloride 0.9 % bolus 1,000 mL (0 mLs Intravenous Stopped 01/19/19 1941)  albuterol (PROVENTIL) (2.5 MG/3ML) 0.083% nebulizer solution 2.5 mg (has no administration in time range)  enoxaparin (LOVENOX) injection 40 mg (has no administration in time range)  0.9 %  sodium chloride infusion (has no administration in time range)  acetaminophen (TYLENOL) tablet 650 mg (has no administration in time range)    Or  acetaminophen (TYLENOL) suppository 650 mg (has no administration in time range)  oxyCODONE (Oxy IR/ROXICODONE) immediate release tablet 5 mg (has no administration in time range)  ketorolac (TORADOL) 30 MG/ML injection 30 mg (has no administration in time range)  traZODone (DESYREL) tablet 25 mg (has no administration  in time range)  bisacodyl (DULCOLAX) EC tablet 5 mg (has no administration in time range)  ondansetron (ZOFRAN) tablet 4 mg (has no administration in time range)    Or  ondansetron (ZOFRAN) injection 4 mg (has no administration in time range)  ketorolac (TORADOL) 30 MG/ML injection 30 mg (30 mg Intravenous Given 01/19/19 1937)  morphine 4 MG/ML injection 4 mg (4 mg Intravenous Given 01/19/19 1938)  ondansetron (ZOFRAN) injection 4 mg (4 mg Intravenous Given 01/19/19 1938)  iohexol (OMNIPAQUE) 300 MG/ML solution 100 mL (100 mLs Intravenous Contrast Given 01/19/19 1922)  sodium chloride 0.9 % bolus 1,000 mL (1,000 mLs Intravenous Bolus 01/19/19 2151)  cefTRIAXone (ROCEPHIN) 2 g in sodium chloride 0.9 % 100 mL IVPB (0 g Intravenous Stopped 01/19/19 2305)  metroNIDAZOLE (FLAGYL) IVPB 500 mg (0 mg Intravenous Stopped 01/20/19 0018)    Mobility walks Low fall risk   Focused Assessments    R Recommendations: See Admitting Provider Note  Report given to:   Additional Notes:  Positive strep

## 2019-01-20 NOTE — Consult Note (Signed)
NAME: Jesse Roy  DOB: 1995-11-29  MRN: [id number]  Date/Time: 01/20/2019 1:22 PM  REQUESTING PROVIDER: Fanny Bien Subjective:  REASON FOR CONSULT: Group A strep ? 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 Pt says on March 3rd   he developed symptoms of URI  with nasal congestion, cough and went to student health and was given zpak. HE felt better with 3 days and even went out on Friday and Saturday and had beer.On the 9th of March he developed sore throat, and then noted body aches and felt LN and went to the student health on THursday. Had a mono test First part neg and was given clindamycin. He took clinda on Friday on empty stomach and 2 hours later had severe nausea and started to induce vomiting. He also noted severe left sided upper abdomen and flank pain. Also had a few episodes of diarrhea. He was getting SOB and asked his girl friend to bring him to the ED .In the ED his vitals were 98.9, BP 128/87 and HR of 97. EKG was done and it was abnormal- CT abdomen showed non specific small rt sided mesenteric lymphnodes. Group A strep was positive in throat swab. WBC was 28.  Dr.Quale spoke to me and started ceftriaxone and Flagyl and IV fluids  Today he is feeling better. Still has some left flank pain for which he got toradol  Oxycodone Pt says he smokes marijuana, takes occasional xanax which helps with his school. 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 No pertinent family history. says his parents are healthy  Copy at [employer], marijuana    No Known Allergies I? Current Facility-Administered Medications  Medication Dose Route Frequency Provider Last Rate Last Dose   0.9 %   sodium chloride infusion   Intravenous Continuous Mansy, Jan A, MD 100 mL/hr at 01/20/19 0201     acetaminophen (TYLENOL) tablet 650 mg  650 mg Oral Q6H PRN Mansy, Jan A, MD       Or   acetaminophen (TYLENOL) suppository 650 mg  650 mg Rectal Q6H PRN Mansy, Jan A, MD       albuterol (PROVENTIL) (2.5 MG/3ML) 0.083% nebulizer solution 2.5 mg  2.5 mg Inhalation Q4H PRN Mansy, Jan A, MD       bisacodyl (DULCOLAX) EC tablet 5 mg  5 mg Oral Daily PRN Mansy, Jan A, MD       cefTRIAXone (ROCEPHIN) 2 g in sodium chloride 0.9 % 100 mL IVPB  2 g Intravenous Q24H Mansy, Jan A, MD       enoxaparin (LOVENOX) injection 40 mg  40 mg Subcutaneous Q24H Mansy, Jan A, MD       ketorolac (TORADOL) 30 MG/ML injection 30 mg  30 mg Intravenous Q6H PRN Mansy, Jan A, MD       metroNIDAZOLE (FLAGYL) IVPB 500 mg  500 mg Intravenous Q8H Mansy, Jan A, MD 100 mL/hr at 01/20/19 0630 500 mg at 01/20/19 0630   ondansetron (ZOFRAN) tablet 4 mg  4 mg Oral Q6H PRN Mansy, Jan A, MD       Or   ondansetron [facility]) injection 4 mg  4 mg Intravenous Q6H PRN Mansy, Jan A, MD       oxyCODONE (Oxy IR/ROXICODONE) immediate release tablet 5 mg  5 mg Oral Q4H PRN Mansy, Jan A, MD   5 mg at 01/20/19 0112   traZODone (DESYREL) tablet 25 mg  25 mg Oral QHS PRN Mansy, Jan A, MD         Abtx:  Anti-infectives (From admission, onward)   Start     Dose/Rate Route Frequency Ordered Stop   01/20/19 2200  cefTRIAXone (ROCEPHIN) 2 g in sodium chloride 0.9 % 100 mL IVPB     2 g 200 mL/hr over 30 Minutes Intravenous Every 24 hours 01/20/19 0536     01/20/19 0700  metroNIDAZOLE (FLAGYL) IVPB 500 mg     500 mg 100 mL/hr over 60 Minutes Intravenous Every 8 hours 01/20/19 0536     01/19/19 2315  metroNIDAZOLE (FLAGYL) IVPB 500 mg     500 mg 100 mL/hr over 60 Minutes Intravenous  Once 01/19/19 2304 01/20/19 0018   01/19/19 2145  cefTRIAXone (ROCEPHIN) 2 g in sodium chloride 0.9 % 100 mL IVPB     2 g 200 mL/hr over 30 Minutes Intravenous   Once 01/19/19 2134 01/19/19 2305      REVIEW OF SYSTEMS:  Const: negative fever, negative chills, negative weight loss Eyes: negative diplopia or visual changes, negative eye pain ENT: ++ coryza, ++sore throat Resp: + cough, - hemoptysis, +dyspnea Cards: negative for chest pain, palpitations, lower extremity edema GU: negative for frequency, dysuria and hematuria GI:  abdominal pain, flank pain  Left , ++diarrhea, -bleeding, constipation Skin: negative for rash and pruritus Heme: negative for easy bruising and gum/nose bleeding MS: ++myalgias, no arthralgias, back pain and muscle weakness Neurolo:negative for headaches, dizziness, vertigo, memory problems  Psych:some anxiety  Endocrine: no polyuria or polydipsia Allergy/Immunology- negative for any medication or food allergies  Objective:  VITALS:  BP 108/65 (BP Location: Right Arm)    Pulse 69    Temp 99 F (37.2 C) (Oral)    Resp 20    Ht 5\' 10"  (1.778 m)    Wt 86.2 kg    SpO2 100%    BMI 27.26 kg/m  PHYSICAL EXAM:  General: Alert, cooperative, no distress, appears stated age.  Head: Normocephalic, without obvious abnormality, atraumatic. Eyes: Conjunctivae clear, anicteric sclerae. Pupils are equal ENT Nares normal. No drainage or sinus tenderness. Oral cavity- pharyngeal erythema,    Vesico/pustular area on the rt tonsillar floor area Absent tonsils  Neck: Supple, symmetrical, no adenopathy, thyroid: non tender no carotid bruit and no JVD. Back: No CVA tenderness. Lungs: Clear to auscultation bilaterally. No Wheezing or Rhonchi. No rales. Heart: Regular rate and rhythm, no murmur, rub or gallop. Abdomen: Soft, non-tender,not distended. Bowel sounds normal. No masses Extremities: atraumatic, no cyanosis. No edema. No clubbing Skin: No rashes or lesions. Or bruising Lymph: Cervical, supraclavicular normal. Neurologic: Grossly non-focal Pertinent Labs Lab Results CBC    Component Value Date/Time   WBC 14.6 (H)  01/20/2019 0314   RBC 5.00 01/20/2019 0314   HGB 15.4 01/20/2019 0314   HCT 44.3 01/20/2019 0314   PLT 252 01/20/2019 0314   MCV 88.6 01/20/2019 0314   MCH 30.8 01/20/2019 0314   MCHC 34.8 01/20/2019 0314   RDW 12.0 01/20/2019 0314   LYMPHSABS 0.5 (L) 01/19/2019 1812   MONOABS 2.0 (H) 01/19/2019 1812   EOSABS 0.0 01/19/2019 1812   BASOSABS 0.1 01/19/2019 1812    CMP Latest  Ref Rng & Units 01/20/2019 01/19/2019  Glucose 70 - 99 mg/dL 852(D) 782(U)  BUN 6 - 20 mg/dL 15 15  Creatinine 2.35 - 1.24 mg/dL 3.61 4.43  Sodium 154 - 145 mmol/L 138 136  Potassium 3.5 - 5.1 mmol/L 4.5 3.8  Chloride 98 - 111 mmol/L 103 101  CO2 22 - 32 mmol/L 25 20(L)  Calcium 8.9 - 10.3 mg/dL 8.9 9.8  Total Protein 6.5 - 8.1 g/dL 7.5 -  Total Bilirubin 0.3 - 1.2 mg/dL 0.6 -  Alkaline Phos 38 - 126 U/L 53 -  AST 15 - 41 U/L 17 -  ALT 0 - 44 U/L 15 -      Microbiology: Recent Results (from the past 240 hour(s))  Group A Strep by PCR ([facility] Only)     Status: Abnormal   Collection Time: 01/19/19  7:42 PM  Result Value Ref Range Status   Group A Strep by PCR DETECTED (A) NOT DETECTED Final    Comment: Performed at [facility], [address] [place], [place] [address]  Blood Culture (routine x 2)     Status: None (Preliminary result)   Collection Time: 01/19/19 11:19 PM  Result Value Ref Range Status   Specimen Description BLOOD LEFT ANTECUBITAL  Final   Special Requests   Final    BOTTLES DRAWN AEROBIC AND ANAEROBIC Blood Culture adequate volume   Culture   Final    NO GROWTH < 12 HOURS Performed at [facility], [address] [place], [place] [address]    Report Status PENDING  Incomplete  Blood Culture (routine x 2)     Status: None (Preliminary result)   Collection Time: 01/19/19 11:21 PM  Result Value Ref Range Status   Specimen Description BLOOD RIGHT ANTECUBITAL  Final   Special Requests   Final    BOTTLES DRAWN AEROBIC AND ANAEROBIC Blood Culture adequate volume    Culture   Final    NO GROWTH < 12 HOURS Performed at [facility], [address] [place], [place] [address]    Report Status PENDING  Incomplete  Gastrointestinal Panel by PCR , Stool     Status: None   Collection Time: 01/20/19  8:49 AM  Result Value Ref Range Status   Campylobacter species NOT DETECTED NOT DETECTED Final   Plesimonas shigelloides NOT DETECTED NOT DETECTED Final   Salmonella species NOT DETECTED NOT DETECTED Final   Yersinia enterocolitica NOT DETECTED NOT DETECTED Final   Vibrio species NOT DETECTED NOT DETECTED Final   Vibrio cholerae NOT DETECTED NOT DETECTED Final   Enteroaggregative E coli (EAEC) NOT DETECTED NOT DETECTED Final   Enteropathogenic E coli (EPEC) NOT DETECTED NOT DETECTED Final   Enterotoxigenic E coli (ETEC) NOT DETECTED NOT DETECTED Final   Shiga like toxin producing E coli (STEC) NOT DETECTED NOT DETECTED Final   Shigella/Enteroinvasive E coli (EIEC) NOT DETECTED NOT DETECTED Final   Cryptosporidium NOT DETECTED NOT DETECTED Final   Cyclospora cayetanensis NOT DETECTED NOT DETECTED Final   Entamoeba histolytica NOT DETECTED NOT DETECTED Final   Giardia lamblia NOT DETECTED NOT DETECTED Final   Adenovirus F40/41 NOT DETECTED NOT DETECTED Final   Astrovirus NOT DETECTED NOT DETECTED Final   Norovirus GI/GII NOT DETECTED NOT DETECTED Final   Rotavirus A NOT DETECTED NOT DETECTED Final   Sapovirus (I, II, IV, and V) NOT DETECTED NOT DETECTED Final    Comment: Performed at [facility], [address], [place], [place] [address]  C difficile quick scan w PCR reflex     Status: None   Collection Time: 01/20/19  8:49 AM  Result Value Ref Range Status   C Diff antigen NEGATIVE NEGATIVE Final   C Diff toxin NEGATIVE NEGATIVE Final   C Diff interpretation No C. difficile detected.  Final    Comment: Performed at [facility], [address] [place], [place] [address]    IMAGING RESULTS:    I have personally  reviewed the films ? Impression/Recommendation ? 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 Pt says on March 3rd   he developed symptoms of URI  with nasal congestion, cough and went to student health and was given zpak. HE felt better with 3 days and even went out on Friday and Saturday and had beer.On the 9th of March he developed sore throat, and then noted body aches and felt LN and went to the student health on THursday. Had a mono test First part neg and was given clindamycin. He took clinda on Friday on empty stomach and 2 hours later had severe nausea and started to induce vomiting. He also noted severe left sided upper abdomen and flank pain. Also had a few episodes of diarrhea. He was getting SOB and asked his girl friend to bring him to the ED ? URI  Strep A pharyngitis with leucocytosis Left abdominal pain , nausea and vomiting could be from Group A strep as well but with abnormal EKG need to r/o heart issues Continue ceftriaxone- if Bloodc ulture neg can be switched to amoxicillin on discharge for a total of 10 days  Leucocytosis with  worsening URI symptoms in the 2nd week  after Azithromycin: need to r/o peritonsillar abscess, fusobacterium infection, lemierre's syndome No IJ thrombosis No tonsils  On flagyl for anaerobic coverage ( fusobacterium) and not for cdiff  If blood culture neg for fusobacterium we may be able to stop it   ___EKGabnormality__T wave inversion in inferior and lateral leads and biatrial enlargement- was his left upper abdominal pain/left flank pain related to cardiac?? ? Ischemia ? Cardiomyopathy ? Post strep complication- no evidence clinically of acute rheumatic fever Will need 2 d echo and cardiology consult  urine tox  ____________________________________________ Discussed with patient,and Dr.PAtel  requesting provider

## 2019-01-20 NOTE — Progress Notes (Signed)
Enteric precautions initiated for rule out following GI Panel. Pnt denies nausea and pain at this time

## 2019-01-21 ENCOUNTER — Inpatient Hospital Stay: Admit: 2019-01-21 | Payer: PRIVATE HEALTH INSURANCE

## 2019-01-21 LAB — TROPONIN I: Troponin I: <0.03 ng/mL (ref <0.03)

## 2019-01-21 LAB — CKMB (ARMC ONLY): CK, MB: 1.1 ng/mL (ref 0.5–5.0)

## 2019-01-21 MED ORDER — AMOXICILLIN-POT CLAVULANATE 875-125 MG PO TABS: 1.0000 | ORAL_TABLET | Freq: Two times a day (BID) | ORAL | 0 refills | Status: AC

## 2019-01-21 MED ORDER — AMOXICILLIN-POT CLAVULANATE 875-125 MG PO TABS
1.0000 | ORAL_TABLET | Freq: Two times a day (BID) | ORAL | Status: DC
  Administered 2019-01-21: 1 via ORAL
  Filled 2019-01-21: qty 1

## 2019-01-21 MED ORDER — SACCHAROMYCES BOULARDII 250 MG PO CAPS: 250.0000 mg | ORAL_CAPSULE | Freq: Two times a day (BID) | ORAL | 0 refills | Status: AC

## 2019-01-21 MED ORDER — SODIUM CHLORIDE 0.9 % IV SOLN
INTRAVENOUS | Status: DC | PRN
  Administered 2019-01-21: 20 mL via INTRAVENOUS

## 2019-01-21 NOTE — Discharge Instructions (Signed)
Patient will follow-up with primary care physician in [facility]

## 2019-01-21 NOTE — Progress Notes (Signed)
Per MD Allena Katz, pt will have echo out patient, Pt can d/c home today.

## 2019-01-21 NOTE — Progress Notes (Signed)
DISCHARGE NOTE:  Pt given discharge instructions, pt verbalized understanding. Pt wheeled to car by staff, pts girlfriend providing transportation.

## 2019-01-21 NOTE — Consult Note (Signed)
[facility] Cardiology Consultation Note  Patient ID: Jesse Roy, MRN: [id number], DOB/AGE: 23-14-1997 23 y.o. Admit date: 01/19/2019   Date of Consult: 01/21/2019 Primary Physician: Patient, No Pcp Per Primary Cardiologist: None  Chief Complaint:  Chief Complaint  Patient presents with  . Shortness of Breath   Reason for Consult: Chest pain with abnormal EKG  HPI: 23 y.o. male with no history of cardiovascular disease and no family history of cardiovascular disease who has history of smoking marijuana having severe episodes of cough congestion for which he has had several rounds of antibiotics.  After the next antibiotic he was given he had significant lower chest and upper abdominal discomfort to a severe degree consistent with possible reaction to the medication.  This lasted for quite some time requiring medication management.  At that time the patient had an EKG showing normal sinus rhythm with left atrial enlargement with lateral T wave inversions of unknown etiology.  There was no evidence of myocardial infarction due to no elevation of troponin.  Patient has had full resolution of all the symptoms at this time.  There has been no physical exam findings of significant changes from the cardiac standpoint and currently he is physically stable  History reviewed. No pertinent past medical history.    Surgical History:  Past Surgical History:  Procedure Laterality Date  . TONSILLECTOMY AND ADENOIDECTOMY    . TYMPANOSTOMY TUBE PLACEMENT       Home Meds: Prior to Admission medications   Medication Sig Start Date End Date Taking? Authorizing Provider  albuterol (PROVENTIL HFA;VENTOLIN HFA) 108 (90 Base) MCG/ACT inhaler Inhale 2 puffs into the lungs every 4 (four) hours as needed. 01/09/19  Yes [provider]  cefdinir (OMNICEF) 300 MG capsule Take 1 capsule by mouth 2 (two) times daily. 01/19/19  Yes [provider]  azithromycin (ZITHROMAX) 250 MG tablet Take 1  tablet by mouth as directed. 01/09/19   [provider]    Inpatient Medications:  . enoxaparin (LOVENOX) injection  40 mg Subcutaneous Q24H   . sodium chloride 100 mL/hr at 01/20/19 0201  . sodium chloride 20 mL (01/21/19 0614)  . cefTRIAXone (ROCEPHIN)  IV 2 g (01/20/19 2126)  . metronidazole 500 mg (01/21/19 0617)    Allergies: No Known Allergies  Social History   Socioeconomic History  . Marital status: Single    Spouse name: Not on file  . Number of children: Not on file  . Years of education: Not on file  . Highest education level: Not on file  Occupational History  . Not on file  Social Needs  . Financial resource strain: Not on file  . Food insecurity:    Worry: Not on file    Inability: Not on file  . Transportation needs:    Medical: Not on file    Non-medical: Not on file  Tobacco Use  . Smoking status: Never Smoker  . Smokeless tobacco: Never Used  Substance and Sexual Activity  . Alcohol use: Yes  . Drug use: Yes    Types: Marijuana  . Sexual activity: Not on file  Lifestyle  . Physical activity:    Days per week: Not on file    Minutes per session: Not on file  . Stress: Not on file  Relationships  . Social connections:    Talks on phone: Not on file    Gets together: Not on file    Attends religious service: Not on file    Active member  of club or organization: Not on file    Attends meetings of clubs or organizations: Not on file    Relationship status: Not on file  . Intimate partner violence:    Fear of current or ex partner: Not on file    Emotionally abused: Not on file    Physically abused: Not on file    Forced sexual activity: Not on file  Other Topics Concern  . Not on file  Social History Narrative  . Not on file     History reviewed. No pertinent family history.   Review of Systems Positive for chest and abdominal pain Negative for: General:  chills, fever, night sweats or weight changes.  Cardiovascular: PND  orthopnea syncope dizziness  Dermatological skin lesions rashes Respiratory: Cough congestion Urologic: Frequent urination urination at night and hematuria Abdominal: negative for nausea, vomiting, diarrhea, bright red blood per rectum, melena, or hematemesis Neurologic: negative for visual changes, and/or hearing changes  All other systems reviewed and are otherwise negative except as noted above.  Labs: Recent Labs    01/19/19 1812 01/20/19 1622 01/20/19 2212 01/21/19 0455  CKMB  --  1.8 1.2  --   TROPONINI <0.03 <0.03 <0.03 <0.03   Lab Results  Component Value Date   WBC 14.6 (H) 01/20/2019   HGB 15.4 01/20/2019   HCT 44.3 01/20/2019   MCV 88.6 01/20/2019   PLT 252 01/20/2019    Recent Labs  Lab 01/20/19 0314  NA 138  K 4.5  CL 103  CO2 25  BUN 15  CREATININE 0.95  CALCIUM 8.9  PROT 7.5  BILITOT 0.6  ALKPHOS 53  ALT 15  AST 17  GLUCOSE 115*   No results found for: CHOL, HDL, LDLCALC, TRIG No results found for: DDIMER  Radiology/Studies:  Dg Chest 2 View  Result Date: 01/19/2019 CLINICAL DATA:  Cough and shortness of breath EXAM: CHEST - 2 VIEW COMPARISON:  None. FINDINGS: Lungs are clear. Heart size and pulmonary vascularity are normal. No adenopathy. There is mild upper thoracic dextroscoliosis. IMPRESSION: No edema or consolidation. Electronically Signed   By: Bretta Bang III M.D.   On: 01/19/2019 18:44   Ct Abdomen Pelvis W Contrast  Result Date: 01/19/2019 CLINICAL DATA:  Abdominal pain and vomiting EXAM: CT ABDOMEN AND PELVIS WITH CONTRAST TECHNIQUE: Multidetector CT imaging of the abdomen and pelvis was performed using the standard protocol following bolus administration of intravenous contrast. CONTRAST:  OMNIPAQUE IOHEXOL 300 MG/ML  SOLN COMPARISON:  None. FINDINGS: Lower chest: Lung bases are clear. Hepatobiliary: No focal liver lesions are apparent. The gallbladder wall is not appreciably thickened. There is no biliary duct dilatation.  Pancreas: There is no pancreatic mass or inflammatory focus. Spleen: No splenic lesions are appreciable. There is a small accessory spleen medially. Adrenals/Urinary Tract: Adrenals bilaterally appear normal. Kidneys bilaterally show no evident mass or hydronephrosis. There is no renal or ureteral calculus on either side. Urinary bladder is midline with wall thickness within normal limits. Stomach/Bowel: There is no appreciable bowel wall or mesenteric thickening. There is no appreciable bowel obstruction. There is no free air or portal venous air. Vascular/Lymphatic: There is no abdominal aortic aneurysm. No vascular lesions are evident. There is no adenopathy in the abdomen or pelvis by size criteria. There are several subcentimeter lymph nodes in the right abdomen. Reproductive: Prostate and seminal vesicles are normal in size and contour. There is no evident pelvic mass. Other: There is no appendiceal region inflammation. There is no abscess  or ascites in the abdomen or pelvis. Musculoskeletal: There are no blastic or lytic bone lesions. There is no intramuscular or abdominal wall lesion. IMPRESSION: 1. There are several right abdominal subcentimeter mesenteric lymph nodes. In the appropriate clinical setting, this finding may indicate a degree of mesenteric adenitis. By size criteria, there is no frank adenopathy in the abdomen or pelvis. 2. No evident bowel obstruction. No abscess in the abdomen pelvis. No appendiceal region inflammatory change. 3.  No renal or ureteral calculus.  No hydronephrosis. Electronically Signed   By: Bretta Bang III M.D.   On: 01/19/2019 19:35    EKG: Normal sinus rhythm with left atrial large minute and Ferrier and lateral T wave inversion  Weights: [employer]   01/19/19 1757  Weight: 86.2 kg     Physical Exam: Blood pressure (!) 141/78, pulse 70, temperature 98.4 F (36.9 C), temperature source Oral, resp. rate 18, height 5\' 10"  (1.778 m), weight 86.2 kg,  SpO2 99 %. Body mass index is 27.26 kg/m. General: Well developed, well nourished, in no acute distress. Head eyes ears nose throat: Normocephalic, atraumatic, sclera non-icteric, no xanthomas, nares are without discharge. No apparent thyromegaly and/or mass  Lungs: Normal respiratory effort.  no wheezes, no rales, no rhonchi.  Heart: RRR with normal S1 S2. no murmur gallop, no rub, PMI is normal size and placement, carotid upstroke normal without bruit, jugular venous pressure is normal Abdomen: Soft, non-tender, non-distended with normoactive bowel sounds. No hepatomegaly. No rebound/guarding. No obvious abdominal masses. Abdominal aorta is normal size without bruit Extremities: No edema. no cyanosis, no clubbing, no ulcers  Peripheral : 2+ bilateral upper extremity pulses, 2+ bilateral femoral pulses, 2+ bilateral dorsal pedal pulse Neuro: Alert and oriented. No facial asymmetry. No focal deficit. Moves all extremities spontaneously. Musculoskeletal: Normal muscle tone without kyphosis Psych:  Responds to questions appropriately with a normal affect.    Assessment: 23 year old male with cough congestion abdominal chest discomfort possibly secondary to illness and or side effect and reaction to medication now resolved with incidental finding of abnormal EKG without evidence of heart failure or myocardial infarction or physical exam findings of structural issues for his heart  Plan: 1.  No further cardiac work-up at this time due to full resolution of symptoms and atypical in nature 2.  Echocardiogram for LV systolic dysfunction valvular heart disease and abnormal EKG as outpatient.  Patient wishes to do this in his home in [place] for which would be appropriate 3.  Begin ambulation and follow for improvements of symptoms and okay for discharge to home at this time from the cardiac standpoint  Signed, Lamar Blinks M.D. [facility] Cardiology 01/21/2019, 7:39 AM

## 2019-01-21 NOTE — Discharge Summary (Signed)
[facility] [place] at [facility]   PATIENT NAME: Jesse Roy    MR#:  [id number]  DATE OF BIRTH:  [birth year]  DATE OF ADMISSION:  01/19/2019 ADMITTING PHYSICIAN: Arnaldo NatalMichael S Diamond, MD  DATE OF DISCHARGE: 01/21/2019  PRIMARY CARE PHYSICIAN: Patient, No Pcp Per    ADMISSION DIAGNOSIS:  EKG abnormalities [R94.31] Group A streptococcal infection [B95.0] Leukocytosis, unspecified type [D72.829] Sepsis, due to unspecified organism, unspecified whether acute organ dysfunction present (HCC) [A41.9]  DISCHARGE DIAGNOSIS:  URI strep A pharyngitis with leukocytosis  SECONDARY DIAGNOSIS:  History reviewed. No pertinent past medical history.  HOSPITAL COURSE:  Jesse Roy a22 y.o.malewith no chronic medical problems who presented to the emergency room with acute onset of sore throat as well as left upper quadrant abdominal pain and recurrent nausea and vomiting with associated left upper back pain leading from his left upper abdominal quadrant  #1. Streptococcal A pharyngitis with URI -on IV Rocephin which should cover his strep pharyngitis. -- change to oral Augmentin for eight more days -prn po tylenol and  IV Toradol for pain. - ID consult with Dr Rivka Saferavishankar appreciated -WBC 28K--14k -afebrile -HR 60's , d/c tele -CLD-- man's regular diet  2. Acute gastroenteritis. He has associated mesenteric adenitis.  -The patient will be placed on hydration with IV normal saline.  -Stool studies  negative -DC IV Flagyl. Blood cultures negative  3. Left upper quadrant abdominal pain. suspected due to mucscle strain from vomiting -Hold surgical consult for now  4. DVT prophylaxis.Subcutaneous Lovenox  5. Chest pain with abnormal EKG on admission. Three sets of cardiac enzymes negative. Patient denies any chest pain at present. Repeat EKG shows improvement in T-wave inversion. Seen by Dr. Gwen PoundsKowalski. No further cardiac workup at present. Patient  wants to do echo of the heart in his hometown. [place]. Recommended him to follow up with [place] primary care [place] in [place].  6. Urine drug screen positive for cannabinoid patient reports taking cocaine on and off however has not done recently. He also takes Xanax of the street often. He did not quantify the dose. I have asked him to abstain from using any of these drugs.  Overall hemodynamically stable. Will discharge to home CONSULTS OBTAINED:  Treatment Team:  Lynn Itoavishankar, Jayashree, MD Lamar BlinksKowalski, Bruce J, MD  DRUG ALLERGIES:  No Known Allergies  DISCHARGE MEDICATIONS:   Allergies as of 01/21/2019   No Known Allergies     Medication List    STOP taking these medications   azithromycin 250 MG tablet Commonly known as:  ZITHROMAX   cefdinir 300 MG capsule Commonly known as:  OMNICEF     TAKE these medications   albuterol 108 (90 Base) MCG/ACT inhaler Commonly known as:  PROVENTIL HFA;VENTOLIN HFA Inhale 2 puffs into the lungs every 4 (four) hours as needed.   amoxicillin-clavulanate 875-125 MG tablet Commonly known as:  AUGMENTIN Take 1 tablet by mouth every 12 (twelve) hours.   saccharomyces boulardii 250 MG capsule Commonly known as:  Florastor Take 1 capsule (250 mg total) by mouth 2 (two) times daily.       If you experience worsening of your admission symptoms, develop shortness of breath, life threatening emergency, suicidal or homicidal thoughts you must seek medical attention immediately by calling 911 or calling your MD immediately  if symptoms less severe.  You Must read complete instructions/literature along with all the possible adverse reactions/side effects for all  the Medicines you take and that have been prescribed to you. Take any new Medicines after you have completely understood and accept all the possible adverse reactions/side effects.   Please note  You were cared for by a  hospitalist during your hospital stay. If you have any questions about your discharge medications or the care you received while you were in the hospital after you are discharged, you can call the unit and asked to speak with the hospitalist on call if the hospitalist that took care of you is not available. Once you are discharged, your primary care physician will handle any further medical issues. Please note that NO REFILLS for any discharge medications will be authorized once you are discharged, as it is imperative that you return to your primary care physician (or establish a relationship with a primary care physician if you do not have one) for your aftercare needs so that they can reassess your need for medications and monitor your lab values. Today   SUBJECTIVE   Feels better. No diarrhea nausea vomiting  VITAL SIGNS:  Blood pressure (!) 141/78, pulse 70, temperature 98.4 F (36.9 C), temperature source Oral, resp. rate 18, height 5\' 10"  (1.778 m), weight 86.2 kg, SpO2 99 %.  I/O:    Intake/Output Summary (Last 24 hours) at 01/21/2019 0900 Last data filed at 01/21/2019 0500 Gross per 24 hour  Intake 695.41 ml  Output 300 ml  Net 395.41 ml    PHYSICAL EXAMINATION:  GENERAL:  23 y.o.-year-old patient lying in the bed with no acute distress.  EYES: Pupils equal, round, reactive to light and accommodation. No scleral icterus. Extraocular muscles intact.  HEENT: Head atraumatic, normocephalic. Oropharynx and nasopharynx clear.  NECK:  Supple, no jugular venous distention. No thyroid enlargement, no tenderness.  LUNGS: Normal breath sounds bilaterally, no wheezing, rales,rhonchi or crepitation. No use of accessory muscles of respiration.  CARDIOVASCULAR: S1, S2 normal. No murmurs, rubs, or gallops.  ABDOMEN: Soft, non-tender, non-distended. Bowel sounds present. No organomegaly or mass.  EXTREMITIES: No pedal edema, cyanosis, or clubbing.  NEUROLOGIC: Cranial nerves II through XII are  intact. Muscle strength 5/5 in all extremities. Sensation intact. Gait not checked.  PSYCHIATRIC: The patient is alert and oriented x 3.  SKIN: No obvious rash, lesion, or ulcer.   DATA REVIEW:   CBC  Recent Labs  Lab 01/20/19 0314  WBC 14.6*  HGB 15.4  HCT 44.3  PLT 252    Chemistries  Recent Labs  Lab 01/20/19 0314  NA 138  K 4.5  CL 103  CO2 25  GLUCOSE 115*  BUN 15  CREATININE 0.95  CALCIUM 8.9  AST 17  ALT 15  ALKPHOS 53  BILITOT 0.6    Microbiology Results   Recent Results (from the past 240 hour(s))  Group A Strep by PCR ([facility] Only)     Status: Abnormal   Collection Time: 01/19/19  7:42 PM  Result Value Ref Range Status   Group A Strep by PCR DETECTED (A) NOT DETECTED Final    Comment: Performed at [facility], [address] [place], [place] [address]  Blood Culture (routine x 2)     Status: None (Preliminary result)   Collection Time: 01/19/19 11:19 PM  Result Value Ref Range Status   Specimen Description BLOOD LEFT ANTECUBITAL  Final   Special Requests   Final    BOTTLES DRAWN AEROBIC AND ANAEROBIC Blood Culture adequate volume   Culture   Final    NO  GROWTH 2 DAYS Performed at [facility], [address] [place], [place] [address]    Report Status PENDING  Incomplete  Blood Culture (routine x 2)     Status: None (Preliminary result)   Collection Time: 01/19/19 11:21 PM  Result Value Ref Range Status   Specimen Description BLOOD RIGHT ANTECUBITAL  Final   Special Requests   Final    BOTTLES DRAWN AEROBIC AND ANAEROBIC Blood Culture adequate volume   Culture   Final    NO GROWTH 2 DAYS Performed at [facility], [address] [place], [place] [address]    Report Status PENDING  Incomplete  Gastrointestinal Panel by PCR , Stool     Status: None   Collection Time: 01/20/19  8:49 AM  Result Value Ref Range Status   Campylobacter species NOT DETECTED NOT DETECTED Final   Plesimonas shigelloides NOT DETECTED  NOT DETECTED Final   Salmonella species NOT DETECTED NOT DETECTED Final   Yersinia enterocolitica NOT DETECTED NOT DETECTED Final   Vibrio species NOT DETECTED NOT DETECTED Final   Vibrio cholerae NOT DETECTED NOT DETECTED Final   Enteroaggregative E coli (EAEC) NOT DETECTED NOT DETECTED Final   Enteropathogenic E coli (EPEC) NOT DETECTED NOT DETECTED Final   Enterotoxigenic E coli (ETEC) NOT DETECTED NOT DETECTED Final   Shiga like toxin producing E coli (STEC) NOT DETECTED NOT DETECTED Final   Shigella/Enteroinvasive E coli (EIEC) NOT DETECTED NOT DETECTED Final   Cryptosporidium NOT DETECTED NOT DETECTED Final   Cyclospora cayetanensis NOT DETECTED NOT DETECTED Final   Entamoeba histolytica NOT DETECTED NOT DETECTED Final   Giardia lamblia NOT DETECTED NOT DETECTED Final   Adenovirus F40/41 NOT DETECTED NOT DETECTED Final   Astrovirus NOT DETECTED NOT DETECTED Final   Norovirus GI/GII NOT DETECTED NOT DETECTED Final   Rotavirus A NOT DETECTED NOT DETECTED Final   Sapovirus (I, II, IV, and V) NOT DETECTED NOT DETECTED Final    Comment: Performed at [facility], [address] [place], [place] [address]  C difficile quick scan w PCR reflex     Status: None   Collection Time: 01/20/19  8:49 AM  Result Value Ref Range Status   C Diff antigen NEGATIVE NEGATIVE Final   C Diff toxin NEGATIVE NEGATIVE Final   C Diff interpretation No C. difficile detected.  Final    Comment: Performed at [facility], [address] [place], [place] [address]    RADIOLOGY:  Dg Chest 2 View  Result Date: 01/19/2019 CLINICAL DATA:  Cough and shortness of breath EXAM: CHEST - 2 VIEW COMPARISON:  None. FINDINGS: Lungs are clear. Heart size and pulmonary vascularity are normal. No adenopathy. There is mild upper thoracic dextroscoliosis. IMPRESSION: No edema or consolidation. Electronically Signed   By: Bretta Bang III M.D.   On: 01/19/2019 18:44   Ct Abdomen Pelvis W  Contrast  Result Date: 01/19/2019 CLINICAL DATA:  Abdominal pain and vomiting EXAM: CT ABDOMEN AND PELVIS WITH CONTRAST TECHNIQUE: Multidetector CT imaging of the abdomen and pelvis was performed using the standard protocol following bolus administration of intravenous contrast. CONTRAST:  OMNIPAQUE IOHEXOL 300 MG/ML  SOLN COMPARISON:  None. FINDINGS: Lower chest: Lung bases are clear. Hepatobiliary: No focal liver lesions are apparent. The gallbladder wall is not appreciably thickened. There is no biliary duct dilatation. Pancreas: There is no pancreatic mass or inflammatory focus. Spleen: No splenic lesions are appreciable. There is a small accessory spleen medially. Adrenals/Urinary Tract: Adrenals  bilaterally appear normal. Kidneys bilaterally show no evident mass or hydronephrosis. There is no renal or ureteral calculus on either side. Urinary bladder is midline with wall thickness within normal limits. Stomach/Bowel: There is no appreciable bowel wall or mesenteric thickening. There is no appreciable bowel obstruction. There is no free air or portal venous air. Vascular/Lymphatic: There is no abdominal aortic aneurysm. No vascular lesions are evident. There is no adenopathy in the abdomen or pelvis by size criteria. There are several subcentimeter lymph nodes in the right abdomen. Reproductive: Prostate and seminal vesicles are normal in size and contour. There is no evident pelvic mass. Other: There is no appendiceal region inflammation. There is no abscess or ascites in the abdomen or pelvis. Musculoskeletal: There are no blastic or lytic bone lesions. There is no intramuscular or abdominal wall lesion. IMPRESSION: 1. There are several right abdominal subcentimeter mesenteric lymph nodes. In the appropriate clinical setting, this finding may indicate a degree of mesenteric adenitis. By size criteria, there is no frank adenopathy in the abdomen or pelvis. 2. No evident bowel obstruction. No abscess in  the abdomen pelvis. No appendiceal region inflammatory change. 3.  No renal or ureteral calculus.  No hydronephrosis. Electronically Signed   By: Bretta Bang III M.D.   On: 01/19/2019 19:35     CODE STATUS:     Code Status Orders  (From admission, onward)         Start     Ordered   01/20/19 0049  Full code  Continuous     01/20/19 0053        Code Status History    This patient has a current code status but no historical code status.      TOTAL TIME TAKING CARE OF THIS PATIENT: *40* minutes.    Enedina Finner M.D on 01/21/2019 at 9:00 AM  Between 7am to 6pm - Pager - [phone] After 6pm go to [email or web address] - password [email or web address]  [facility]  Office  [phone]  CC: Primary care physician; Patient, No Pcp Per

## 2019-01-23 LAB — HIV ANTIBODY (ROUTINE TESTING W REFLEX): HIV Screen 4th Generation wRfx: NONREACTIVE

## 2019-01-24 LAB — CULTURE, BLOOD (ROUTINE X 2)
Culture: NO GROWTH
Culture: NO GROWTH
Special Requests: ADEQUATE
Special Requests: ADEQUATE

## 2020-03-11 IMAGING — CT CT ABDOMEN AND PELVIS WITH CONTRAST
2 of 4 series · 16 of 46 positions shown, 18 images · IV contrast (APPLIED)
Comparison: None.

CLINICAL DATA: Abdominal pain and vomiting

EXAM:
CT ABDOMEN AND PELVIS WITH CONTRAST
TECHNIQUE: Multidetector CT imaging of the abdomen and pelvis was performed
using the standard protocol following bolus administration of
intravenous contrast.
CONTRAST:  100mL OMNIPAQUE IOHEXOL 300 MG/ML  SOLN

[Series 2: axial st · axial · 0.77mm/px · z∈[-580,-135]mm · 13 of 97 slices shown, 15 images]
[im 4/97  soft-tissue]
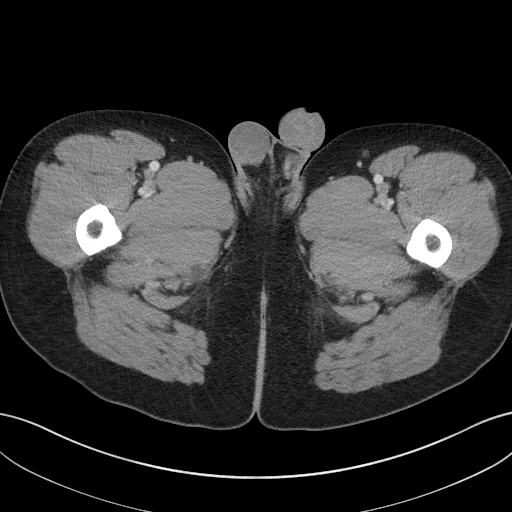
[im 4/97  bone]
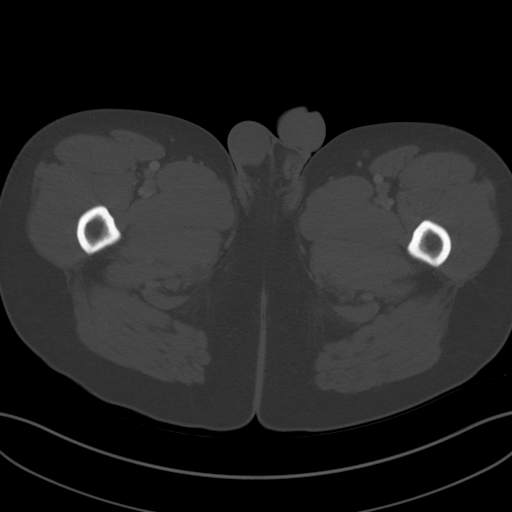
[im 12/97  soft-tissue]
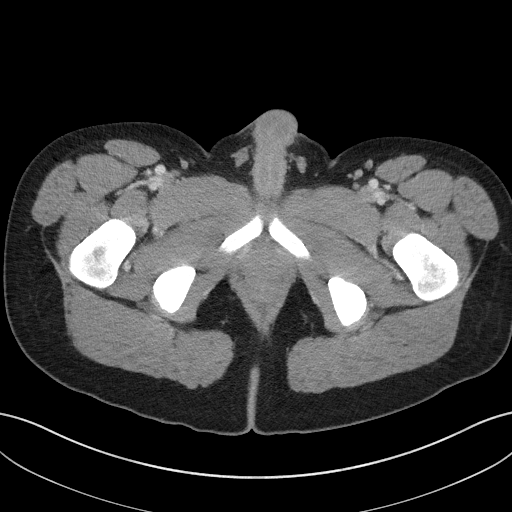
[im 20/97  soft-tissue]
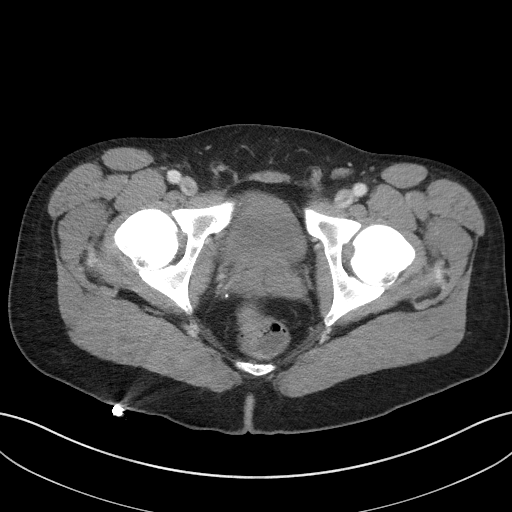
[im 27/97  soft-tissue]
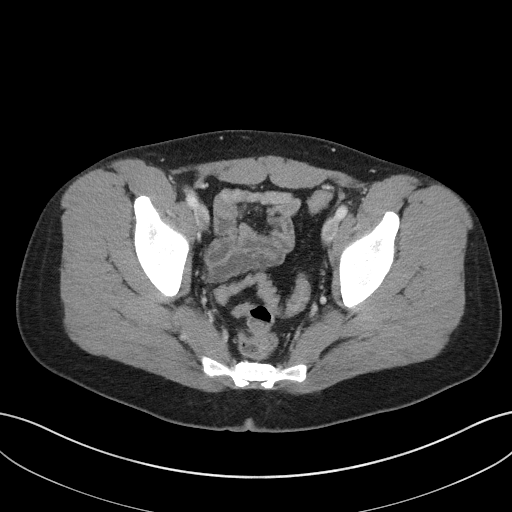
[im 35/97  soft-tissue]
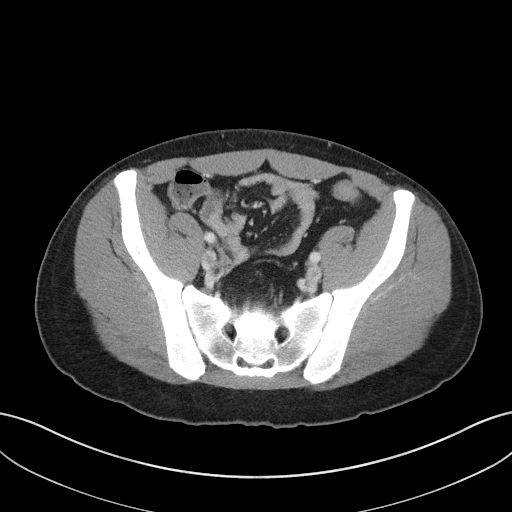
[im 43/97  soft-tissue]
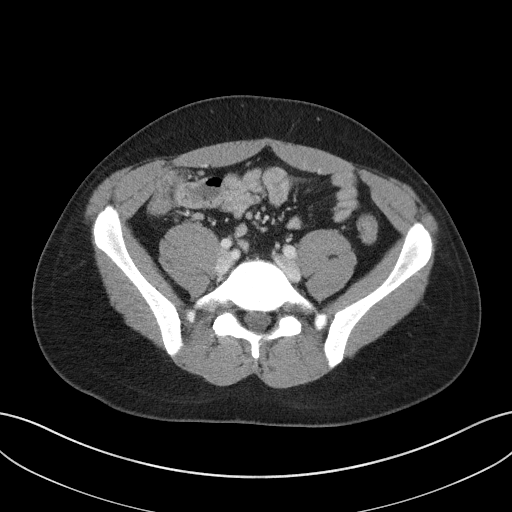
[im 50/97  soft-tissue]
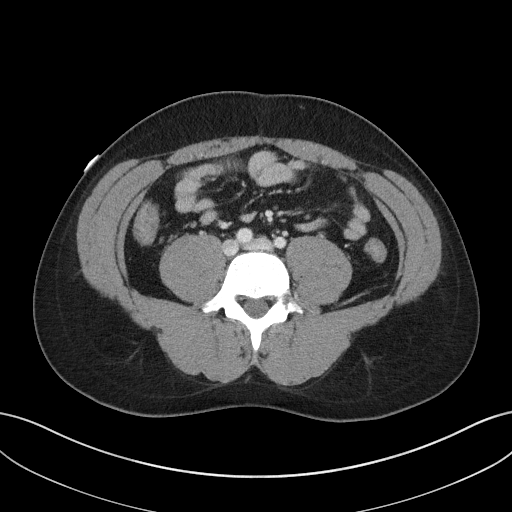
[im 54/97  soft-tissue]
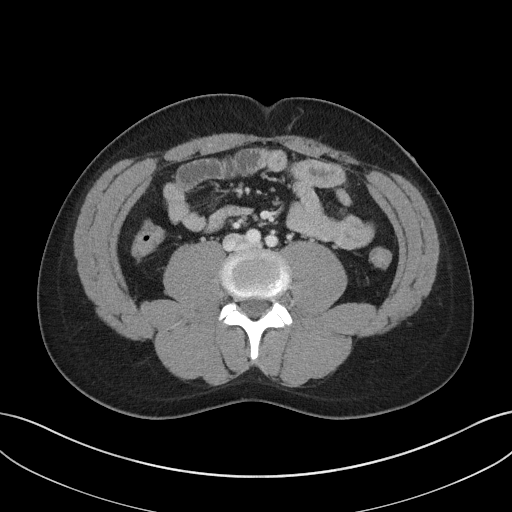
[im 62/97  soft-tissue]
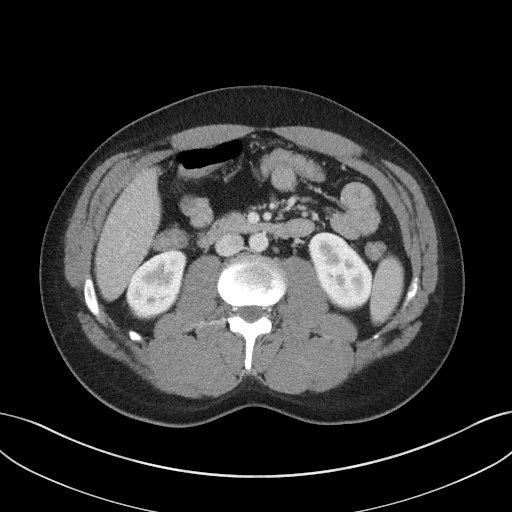
[im 62/97  bone]
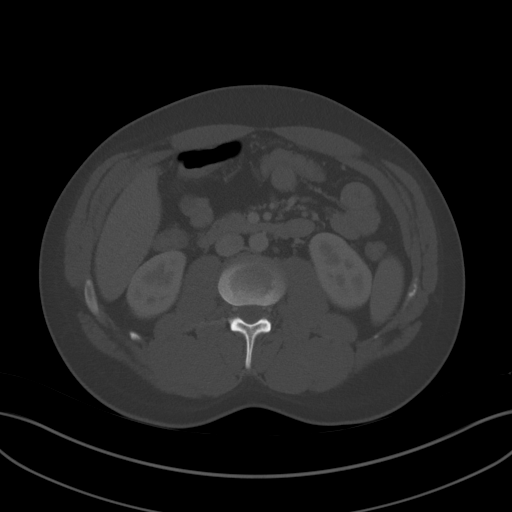
[im 70/97  soft-tissue]
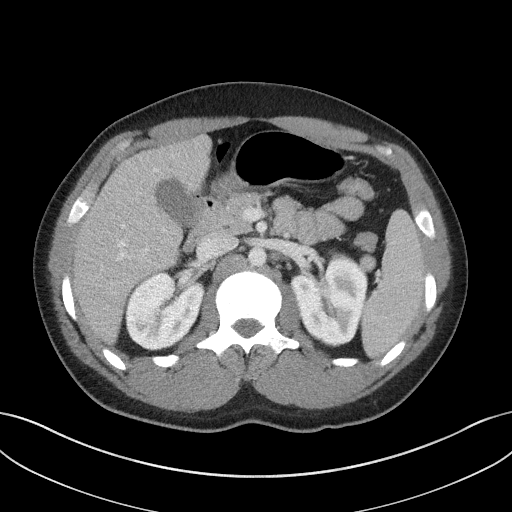
[im 77/97  soft-tissue]
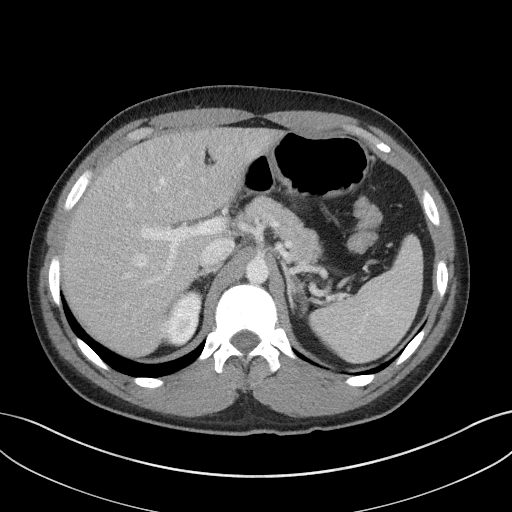
[im 85/97  soft-tissue]
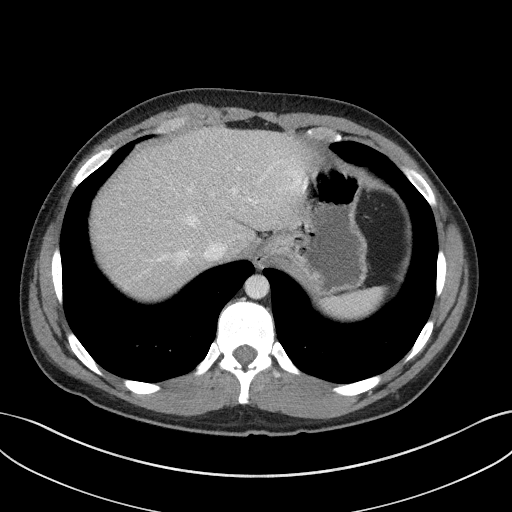
[im 93/97  soft-tissue]
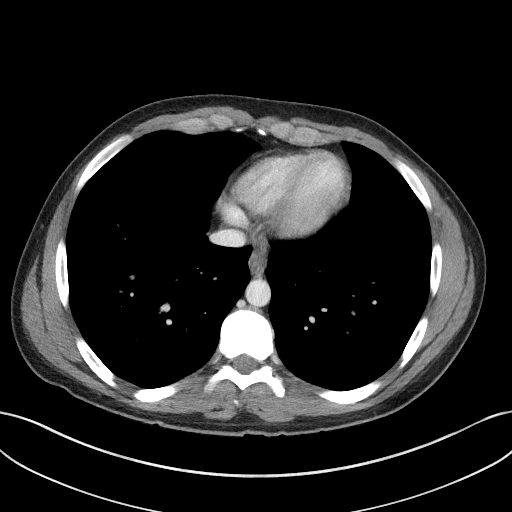

[Series 5: coronal st · coronal · 0.81mm/px · 3 of 81 slices shown]
[im 27/81  soft-tissue]
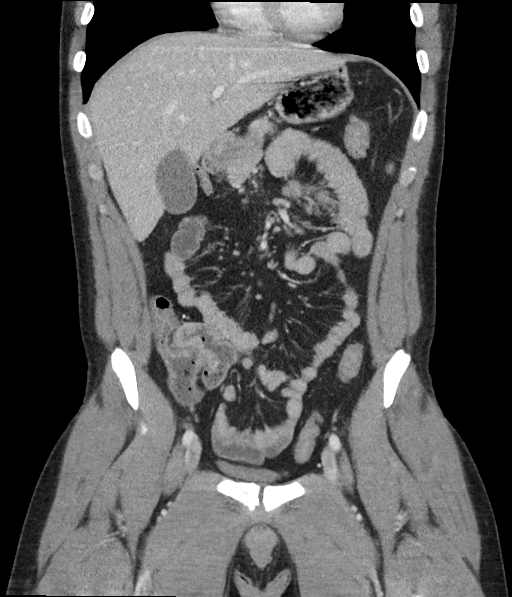
[im 36/81  soft-tissue]
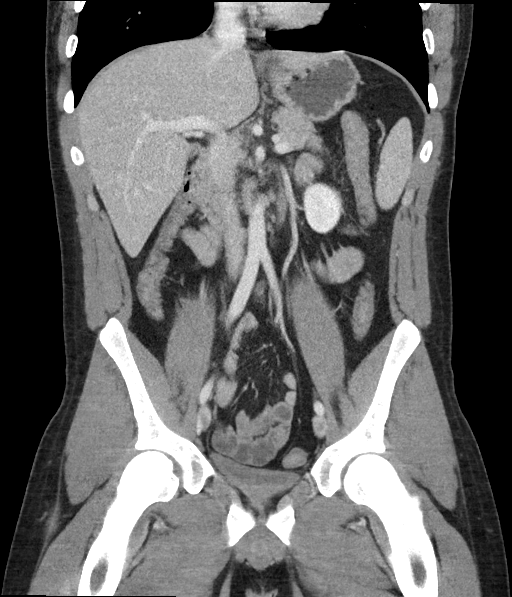
[im 45/81  soft-tissue]
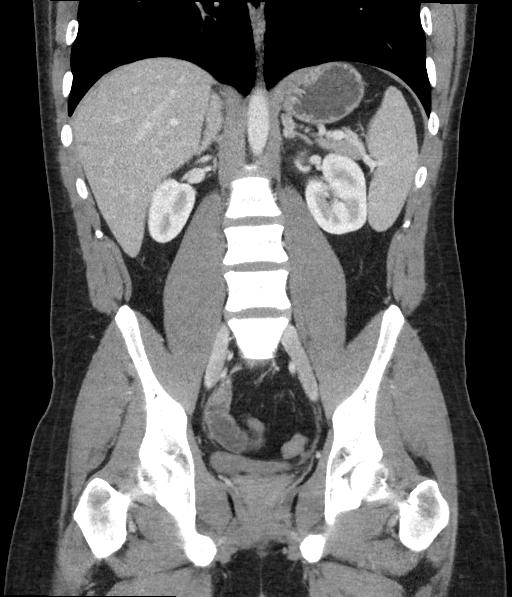

[16 of 46 positions shown; findings below may reference images not displayed]

FINDINGS: Lower chest: Lung bases are clear.

Hepatobiliary: No focal liver lesions are apparent. The gallbladder
wall is not appreciably thickened. There is no biliary duct
dilatation.

Pancreas: There is no pancreatic mass or inflammatory focus.

Spleen: No splenic lesions are appreciable. There is a small
accessory spleen medially.

Adrenals/Urinary Tract: Adrenals bilaterally appear normal. Kidneys
bilaterally show no evident mass or hydronephrosis. There is no
renal or ureteral calculus on either side. Urinary bladder is
midline with wall thickness within normal limits.

Stomach/Bowel: There is no appreciable bowel wall or mesenteric
thickening. There is no appreciable bowel obstruction. There is no
free air or portal venous air.

Vascular/Lymphatic: There is no abdominal aortic aneurysm. No
vascular lesions are evident. There is no adenopathy in the abdomen
or pelvis by size criteria. There are several subcentimeter lymph
nodes in the right abdomen.

Reproductive: Prostate and seminal vesicles are normal in size and
contour. There is no evident pelvic mass.

Other: There is no appendiceal region inflammation. There is no
abscess or ascites in the abdomen or pelvis.

Musculoskeletal: There are no blastic or lytic bone lesions. There
is no intramuscular or abdominal wall lesion.
IMPRESSION: 1. There are several right abdominal subcentimeter mesenteric lymph
nodes. In the appropriate clinical setting, this finding may
indicate a degree of mesenteric adenitis. By size criteria, there is
no frank adenopathy in the abdomen or pelvis.

2. No evident bowel obstruction. No abscess in the abdomen pelvis.
No appendiceal region inflammatory change.

3.  No renal or ureteral calculus.  No hydronephrosis.
